# Patient Record
Sex: Female | Born: 1960 | Race: White | Hispanic: No | Marital: Single | Smoking: Never smoker
Health system: Southern US, Community
[De-identification: ages and names within clinical notes are randomized; demographics above are authoritative.]

## PROBLEM LIST (undated history)

## (undated) DIAGNOSIS — D0590 Unspecified type of carcinoma in situ of unspecified breast: Secondary | ICD-10-CM

## (undated) DIAGNOSIS — C801 Malignant (primary) neoplasm, unspecified: Secondary | ICD-10-CM

## (undated) DIAGNOSIS — N63 Unspecified lump in unspecified breast: Secondary | ICD-10-CM

## (undated) DIAGNOSIS — C50919 Malignant neoplasm of unspecified site of unspecified female breast: Secondary | ICD-10-CM

## (undated) DIAGNOSIS — N61 Mastitis without abscess: Secondary | ICD-10-CM

## (undated) DIAGNOSIS — M779 Enthesopathy, unspecified: Secondary | ICD-10-CM

## (undated) DIAGNOSIS — L02219 Cutaneous abscess of trunk, unspecified: Secondary | ICD-10-CM

## (undated) DIAGNOSIS — L03319 Cellulitis of trunk, unspecified: Secondary | ICD-10-CM

## (undated) DIAGNOSIS — Z923 Personal history of irradiation: Secondary | ICD-10-CM

## (undated) DIAGNOSIS — K769 Liver disease, unspecified: Secondary | ICD-10-CM

## (undated) HISTORY — DX: Malignant (primary) neoplasm, unspecified: C80.1

## (undated) HISTORY — PX: TONSILLECTOMY: SUR1361

## (undated) HISTORY — DX: Unspecified lump in unspecified breast: N63.0

## (undated) HISTORY — DX: Cutaneous abscess of trunk, unspecified: L02.219

## (undated) HISTORY — DX: Mastitis without abscess: N61.0

## (undated) HISTORY — DX: Enthesopathy, unspecified: M77.9

## (undated) HISTORY — DX: Unspecified type of carcinoma in situ of unspecified breast: D05.90

## (undated) HISTORY — DX: Cellulitis of trunk, unspecified: L03.319

## (undated) HISTORY — DX: Liver disease, unspecified: K76.9

---

## 1983-01-27 HISTORY — PX: ABDOMINAL HYSTERECTOMY: SHX81

## 2004-12-09 ENCOUNTER — Ambulatory Visit: Payer: Self-pay | Admitting: Otolaryngology

## 2006-02-28 ENCOUNTER — Emergency Department: Payer: Self-pay | Admitting: Unknown Physician Specialty

## 2007-02-17 ENCOUNTER — Ambulatory Visit: Payer: Self-pay | Admitting: Family Medicine

## 2007-12-15 DIAGNOSIS — G510 Bell's palsy: Secondary | ICD-10-CM | POA: Insufficient documentation

## 2008-01-27 DIAGNOSIS — C50919 Malignant neoplasm of unspecified site of unspecified female breast: Secondary | ICD-10-CM

## 2008-01-27 DIAGNOSIS — N63 Unspecified lump in unspecified breast: Secondary | ICD-10-CM

## 2008-01-27 DIAGNOSIS — C801 Malignant (primary) neoplasm, unspecified: Secondary | ICD-10-CM

## 2008-01-27 DIAGNOSIS — D0590 Unspecified type of carcinoma in situ of unspecified breast: Secondary | ICD-10-CM

## 2008-01-27 HISTORY — DX: Malignant (primary) neoplasm, unspecified: C80.1

## 2008-01-27 HISTORY — DX: Malignant neoplasm of unspecified site of unspecified female breast: C50.919

## 2008-01-27 HISTORY — PX: BREAST LUMPECTOMY: SHX2

## 2008-01-27 HISTORY — PX: BREAST SURGERY: SHX581

## 2008-01-27 HISTORY — DX: Unspecified type of carcinoma in situ of unspecified breast: D05.90

## 2008-01-27 HISTORY — DX: Unspecified lump in unspecified breast: N63.0

## 2008-12-26 ENCOUNTER — Ambulatory Visit: Payer: Self-pay | Admitting: Radiation Oncology

## 2008-12-26 HISTORY — PX: BREAST BIOPSY: SHX20

## 2009-01-08 ENCOUNTER — Ambulatory Visit: Payer: Self-pay | Admitting: General Surgery

## 2009-01-10 ENCOUNTER — Ambulatory Visit: Payer: Self-pay | Admitting: General Surgery

## 2009-01-24 ENCOUNTER — Ambulatory Visit: Payer: Self-pay | Admitting: Radiation Oncology

## 2009-01-26 ENCOUNTER — Ambulatory Visit: Payer: Self-pay | Admitting: Radiation Oncology

## 2009-01-26 DIAGNOSIS — M779 Enthesopathy, unspecified: Secondary | ICD-10-CM

## 2009-01-26 HISTORY — DX: Enthesopathy, unspecified: M77.9

## 2009-02-04 ENCOUNTER — Ambulatory Visit: Payer: Self-pay | Admitting: Radiation Oncology

## 2009-02-26 ENCOUNTER — Ambulatory Visit: Payer: Self-pay | Admitting: Radiation Oncology

## 2009-03-26 ENCOUNTER — Ambulatory Visit: Payer: Self-pay | Admitting: Radiation Oncology

## 2009-05-28 ENCOUNTER — Ambulatory Visit: Payer: Self-pay | Admitting: General Surgery

## 2009-06-26 ENCOUNTER — Ambulatory Visit: Payer: Self-pay | Admitting: Oncology

## 2009-07-08 ENCOUNTER — Ambulatory Visit: Payer: Self-pay | Admitting: Oncology

## 2009-07-26 ENCOUNTER — Ambulatory Visit: Payer: Self-pay | Admitting: Oncology

## 2009-08-19 ENCOUNTER — Ambulatory Visit: Payer: Self-pay | Admitting: Oncology

## 2009-08-26 ENCOUNTER — Ambulatory Visit: Payer: Self-pay | Admitting: Oncology

## 2009-09-02 ENCOUNTER — Ambulatory Visit: Payer: Self-pay | Admitting: Oncology

## 2009-11-27 ENCOUNTER — Ambulatory Visit: Payer: Self-pay | Admitting: Oncology

## 2009-12-03 ENCOUNTER — Ambulatory Visit: Payer: Self-pay | Admitting: General Surgery

## 2009-12-26 ENCOUNTER — Ambulatory Visit: Payer: Self-pay | Admitting: Oncology

## 2010-01-26 DIAGNOSIS — N61 Mastitis without abscess: Secondary | ICD-10-CM

## 2010-01-26 HISTORY — DX: Mastitis without abscess: N61.0

## 2010-04-03 ENCOUNTER — Ambulatory Visit: Payer: Self-pay | Admitting: Family Medicine

## 2010-05-12 ENCOUNTER — Ambulatory Visit: Payer: Self-pay | Admitting: Oncology

## 2010-05-27 ENCOUNTER — Ambulatory Visit: Payer: Self-pay | Admitting: Oncology

## 2010-06-05 ENCOUNTER — Ambulatory Visit: Payer: Self-pay | Admitting: General Surgery

## 2010-06-27 ENCOUNTER — Ambulatory Visit: Payer: Self-pay | Admitting: Oncology

## 2010-08-27 ENCOUNTER — Ambulatory Visit: Payer: Self-pay | Admitting: Oncology

## 2010-09-27 ENCOUNTER — Ambulatory Visit: Payer: Self-pay | Admitting: Oncology

## 2010-10-27 ENCOUNTER — Ambulatory Visit: Payer: Self-pay | Admitting: Oncology

## 2010-11-27 ENCOUNTER — Ambulatory Visit: Payer: Self-pay | Admitting: Oncology

## 2010-12-04 ENCOUNTER — Ambulatory Visit: Payer: Self-pay | Admitting: General Surgery

## 2011-01-27 HISTORY — PX: COLONOSCOPY: SHX174

## 2011-02-10 ENCOUNTER — Ambulatory Visit: Payer: Self-pay | Admitting: Oncology

## 2011-02-27 ENCOUNTER — Ambulatory Visit: Payer: Self-pay | Admitting: Oncology

## 2011-06-10 ENCOUNTER — Ambulatory Visit: Payer: Self-pay | Admitting: Oncology

## 2011-06-27 ENCOUNTER — Ambulatory Visit: Payer: Self-pay | Admitting: Oncology

## 2011-12-08 ENCOUNTER — Ambulatory Visit: Payer: Self-pay | Admitting: General Surgery

## 2012-03-18 ENCOUNTER — Ambulatory Visit: Payer: Self-pay | Admitting: Gastroenterology

## 2012-06-10 ENCOUNTER — Emergency Department: Payer: Self-pay | Admitting: Emergency Medicine

## 2012-06-10 LAB — CBC
HCT: 42.2 % (ref 35.0–47.0)
HGB: 14.8 g/dL (ref 12.0–16.0)
MCH: 29.7 pg (ref 26.0–34.0)
MCHC: 35.2 g/dL (ref 32.0–36.0)
MCV: 84 fL (ref 80–100)
RBC: 4.99 10*6/uL (ref 3.80–5.20)
RDW: 13.7 % (ref 11.5–14.5)
WBC: 8.8 10*3/uL (ref 3.6–11.0)

## 2012-06-10 LAB — URINALYSIS, COMPLETE
RBC,UR: 2203 /HPF (ref 0–5)
Specific Gravity: 1.022 (ref 1.003–1.030)
WBC UR: 2 /HPF (ref 0–5)

## 2012-06-10 LAB — COMPREHENSIVE METABOLIC PANEL
Albumin: 4.4 g/dL (ref 3.4–5.0)
Anion Gap: 9 (ref 7–16)
Calcium, Total: 9.3 mg/dL (ref 8.5–10.1)
Chloride: 107 mmol/L (ref 98–107)
Creatinine: 0.79 mg/dL (ref 0.60–1.30)
EGFR (African American): >60
Glucose: 125 mg/dL — ABNORMAL HIGH (ref 65–99)
Potassium: 3.3 mmol/L — ABNORMAL LOW (ref 3.5–5.1)
SGOT(AST): 26 U/L (ref 15–37)
SGPT (ALT): 23 U/L (ref 12–78)
Sodium: 138 mmol/L (ref 136–145)
Total Protein: 8.6 g/dL — ABNORMAL HIGH (ref 6.4–8.2)

## 2012-06-12 ENCOUNTER — Emergency Department: Payer: Self-pay | Admitting: Internal Medicine

## 2012-06-12 LAB — URINALYSIS, COMPLETE
Bilirubin,UR: NEGATIVE
Glucose,UR: NEGATIVE mg/dL (ref 0–75)
Leukocyte Esterase: NEGATIVE
Nitrite: NEGATIVE
Ph: 7 (ref 4.5–8.0)
Protein: NEGATIVE
RBC,UR: 1 /HPF (ref 0–5)
Squamous Epithelial: 1
WBC UR: 1 /HPF (ref 0–5)

## 2012-06-13 ENCOUNTER — Ambulatory Visit: Payer: Self-pay

## 2012-07-12 ENCOUNTER — Encounter: Payer: Self-pay | Admitting: General Surgery

## 2012-07-12 ENCOUNTER — Ambulatory Visit (INDEPENDENT_AMBULATORY_CARE_PROVIDER_SITE_OTHER): Payer: 59 | Admitting: General Surgery

## 2012-07-12 ENCOUNTER — Other Ambulatory Visit: Payer: Self-pay

## 2012-07-12 VITALS — BP 128/84 | HR 78 | Resp 14 | Ht 67.0 in | Wt 175.0 lb

## 2012-07-12 DIAGNOSIS — N63 Unspecified lump in unspecified breast: Secondary | ICD-10-CM

## 2012-07-12 DIAGNOSIS — Z853 Personal history of malignant neoplasm of breast: Secondary | ICD-10-CM | POA: Insufficient documentation

## 2012-07-12 NOTE — Patient Instructions (Addendum)
Continue self breast exams. Call office for any new breast issues or concerns. Follow up November bilateral mammograms

## 2012-07-12 NOTE — Progress Notes (Signed)
Patient ID: Chelsea Velasquez, female   DOB: Sep 28, 1960, 52 y.o.   MRN: [id number]  Chief Complaint  Patient presents with  . Other    left breast lomp    HPI Chelsea Velasquez is a 52 y.o. female Who presents for a breast evaluation referred by Dr Doylene Canning. The most recent mammogram was done on 12/08/11.Patient does perform regular self breast checks and gets regular mammograms done. Family history of breast cancer includes maternal grandmother. Patient feels a left breast lump upper portion for about 3 weeks. 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 Left breast biopsy was done 02-17-12, benign.   HPI  Past Medical History  Diagnosis Date  . Cancer 2010    left  . Cellulitis and abscess of trunk   . Liver problem   . Tendonitis 2011    left shoulder  . Inflammatory disease of breast 2012  . 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 Lump or mass in breast 2010    left breast    Past Surgical History  Procedure Laterality Date  . Breast surgery Left 2010    lumpectomy  . Breast biopsy Left 02-17-12    benign  . Abdominal hysterectomy  1985  . Tonsillectomy      Family History  Problem Relation Age of Onset  . Breast cancer Paternal Grandmother   . Cancer Paternal Grandmother 91    breast cancer  . Heart disease Father     died age 13  . Cancer Brother 48    throat    Social History History  Substance Use Topics  . Smoking status: Never Smoker   . Smokeless tobacco: Not on file  . Alcohol Use: No    No Known Allergies  No current outpatient prescriptions on file.   No current facility-administered medications for this visit.    Review of Systems Review of Systems  Constitutional: Negative.   Respiratory: Negative.   Cardiovascular: Negative.     Blood pressure 128/84, pulse 78, resp. rate 14, height 5\' 7"  (1.702 m), weight 175 lb (79.379 kg).  Physical Exam Physical Exam  Constitutional: She is oriented  to person, place, and time. She appears well-developed and well-nourished.  Pulmonary/Chest: Right breast exhibits no inverted nipple, no mass, no nipple discharge, no skin change and no tenderness. Left breast exhibits mass and tenderness. Left breast exhibits no inverted nipple, no nipple discharge and no skin change.  Lymphadenopathy:    She has no cervical adenopathy.    She has no axillary adenopathy.  Neurological: She is alert and oriented to person, place, and time.  Skin: Skin is warm.  1.5 cm mass left breast 10-11 o'clock position. 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 F/u as scheduled.        Catherene Kaleta G 07/13/2012, 4:09 PM

## 2012-07-13 ENCOUNTER — Encounter: Payer: Self-pay | Admitting: General Surgery

## 2012-07-13 ENCOUNTER — Encounter: Payer: Self-pay | Admitting: *Deleted

## 2012-07-13 DIAGNOSIS — N63 Unspecified lump in unspecified breast: Secondary | ICD-10-CM | POA: Insufficient documentation

## 2012-07-19 ENCOUNTER — Telehealth: Payer: Self-pay | Admitting: *Deleted

## 2012-07-19 NOTE — Telephone Encounter (Signed)
Pt called requesting order for CA 125 lab work to be done "peace of mind" with history of breast cancer.  RX left at front desk for pt to pick up to have labs done per Dr Evette Cristal.

## 2012-07-21 ENCOUNTER — Encounter: Payer: Self-pay | Admitting: General Surgery

## 2012-07-26 ENCOUNTER — Telehealth: Payer: Self-pay | Admitting: *Deleted

## 2012-07-26 NOTE — Telephone Encounter (Signed)
Message copied by Currie Paris on Tue Jul 26, 2012  9:18 AM ------      Message from: Kieth Brightly      Created: Sun Jul 24, 2012  9:30 AM       CA 125 is normal. Please inform pt. ------

## 2012-07-26 NOTE — Telephone Encounter (Signed)
Notified patient as instructed, patient pleased. Discussed follow-up appointments, patient agrees  

## 2012-08-26 HISTORY — PX: BREAST CYST ASPIRATION: SHX578

## 2012-12-13 ENCOUNTER — Ambulatory Visit: Payer: Self-pay | Admitting: General Surgery

## 2012-12-14 ENCOUNTER — Encounter: Payer: Self-pay | Admitting: General Surgery

## 2012-12-19 ENCOUNTER — Encounter: Payer: Self-pay | Admitting: General Surgery

## 2012-12-19 ENCOUNTER — Ambulatory Visit (INDEPENDENT_AMBULATORY_CARE_PROVIDER_SITE_OTHER): Payer: 59 | Admitting: General Surgery

## 2012-12-19 VITALS — BP 110/80 | HR 72 | Resp 12 | Ht 67.0 in | Wt 179.0 lb

## 2012-12-19 DIAGNOSIS — Z853 Personal history of malignant neoplasm of breast: Secondary | ICD-10-CM

## 2012-12-19 NOTE — Patient Instructions (Signed)
Return in 1 year with bilateral diagnostic mammogram. Schedule appointment with Dr. Doylene Canning.

## 2012-12-19 NOTE — Progress Notes (Signed)
Patient ID: Chelsea Velasquez, female   DOB: 1960/08/09, 52 y.o.   MRN: [id number]  Chief Complaint  Patient presents with  . Follow-up    mammogram     HPI Chelsea Velasquez is a 52 y.o. female who presents for a breast evaluation. The most recent mammogram was done on 12/13/12. Patient does perform regular self breast checks and gets regular mammograms done. The patient denies any new problems with the breasts at this time.    HPI  Past Medical History  Diagnosis Date  . Cancer 2010    left  . Cellulitis and abscess of trunk   . Liver problem   . Tendonitis 2011    left shoulder  . Inflammatory disease of breast 2012  . 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 Lump or mass in breast 2010    left breast    Past Surgical History  Procedure Laterality Date  . Breast surgery Left 2010    lumpectomy  . Breast biopsy Left 02-17-12    benign  . Abdominal hysterectomy  1985  . Tonsillectomy      Family History  Problem Relation Age of Onset  . Breast cancer Paternal Grandmother   . Cancer Paternal Grandmother 15    breast cancer  . Heart disease Father     died age 15  . Cancer Brother 48    throat    Social History History  Substance Use Topics  . Smoking status: Never Smoker   . Smokeless tobacco: Not on file  . Alcohol Use: No    No Known Allergies  Current Outpatient Prescriptions  Medication Sig Dispense Refill  . escitalopram (LEXAPRO) 10 MG tablet Take 1 tablet by mouth daily.      Marland Kitchen gabapentin (NEURONTIN) 300 MG capsule Take 1 capsule by mouth daily.       No current facility-administered medications for this visit.    Review of Systems Review of Systems  Constitutional: Negative.   Respiratory: Negative.   Cardiovascular: Negative.     Blood pressure 110/80, pulse 72, resp. rate 12, height 5\' 7"  (1.702 m), weight 179 lb (81.194 kg).  Physical Exam Physical Exam  Constitutional: She is oriented to person, place, and time. She appears  well-developed and well-nourished.  Eyes: Conjunctivae are normal. No scleral icterus.  Cardiovascular: Normal rate, regular rhythm and normal heart sounds.   Pulmonary/Chest: Breath sounds normal. Right breast exhibits no inverted nipple, no mass, no nipple discharge, no skin change and no tenderness. Left breast exhibits no inverted nipple, no mass, no nipple discharge, no skin change and no tenderness. Breasts are symmetrical.  Abdominal: Soft. Bowel sounds are normal. There is no tenderness.  Lymphadenopathy:    She has no cervical adenopathy.    She has no axillary adenopathy.  Neurological: She is alert and oriented to person, place, and time.  Skin: Skin is warm and dry.    Data Reviewed Mammogram reviewed. Labs reviewed.  Assessment    Stable exam. Left breast DCIS treated with lumpectomy/radiation  Patient is concerned about high levels of estrogen recently tested by Dr. Elease Hashimoto. She had discontinued Tamoxifen because of liver function abnormalities. She has had bilateral oophorectomy.     Plan    Patient to get appointment with Dr. Doylene Canning. Patient to return in 1 year with bilateral diagnostic mammogram.     SANKAR,SEEPLAPUTHUR G 12/21/2012, 6:18 AM

## 2012-12-20 ENCOUNTER — Telehealth: Payer: Self-pay | Admitting: *Deleted

## 2012-12-20 DIAGNOSIS — Z853 Personal history of malignant neoplasm of breast: Secondary | ICD-10-CM

## 2012-12-20 NOTE — Telephone Encounter (Signed)
Patient has been scheduled for an appointment with Dr. Doylene Canning at the [facility] for 12-27-12 at 2:30 pm. She is aware of date, time, and instructions.

## 2012-12-21 ENCOUNTER — Encounter: Payer: Self-pay | Admitting: General Surgery

## 2012-12-27 ENCOUNTER — Ambulatory Visit: Payer: Self-pay | Admitting: Oncology

## 2013-01-26 ENCOUNTER — Ambulatory Visit: Payer: Self-pay | Admitting: Oncology

## 2013-03-15 ENCOUNTER — Ambulatory Visit: Payer: Self-pay | Admitting: Family Medicine

## 2013-07-10 ENCOUNTER — Telehealth: Payer: Self-pay

## 2013-07-10 NOTE — Telephone Encounter (Signed)
Patient called and had some concerns about a breast mass that she has had for a while. She said that Dr Jamal Collin was aware of the area and it had done an ultrasound on it the last time she was here. She states that the area is still painful and that it has not improved since last seen. She would like a call back to speak with some one about this.

## 2013-07-11 ENCOUNTER — Telehealth: Payer: Self-pay | Admitting: *Deleted

## 2013-07-11 NOTE — Telephone Encounter (Signed)
States there is still a knot in her breast. Discussed $ being an issue. She thinks she might be too overly anxious. Always has pain that is unchanged. She does think it might be larger. Ice/heat gentle massage/Aleve all recommended. States she will Dr Venia Minks first then come here if needed. Will leave in recalls for now.

## 2013-07-11 NOTE — Telephone Encounter (Signed)
Pt is called you back, she said she had a voice mail from you. Best number to reach her today is [phone]

## 2013-11-13 ENCOUNTER — Encounter: Payer: Self-pay | Admitting: General Surgery

## 2013-11-22 ENCOUNTER — Ambulatory Visit: Payer: Self-pay | Admitting: Oncology

## 2013-11-26 ENCOUNTER — Ambulatory Visit: Payer: Self-pay | Admitting: Oncology

## 2013-11-27 ENCOUNTER — Encounter: Payer: Self-pay | Admitting: General Surgery

## 2013-12-25 ENCOUNTER — Ambulatory Visit: Payer: 59 | Admitting: General Surgery

## 2013-12-27 ENCOUNTER — Encounter: Payer: Self-pay | Admitting: General Surgery

## 2013-12-27 ENCOUNTER — Ambulatory Visit (INDEPENDENT_AMBULATORY_CARE_PROVIDER_SITE_OTHER): Payer: 59 | Admitting: General Surgery

## 2013-12-27 VITALS — BP 104/68 | HR 70 | Resp 12 | Ht 67.0 in | Wt 167.0 lb

## 2013-12-27 DIAGNOSIS — Z853 Personal history of malignant neoplasm of breast: Secondary | ICD-10-CM

## 2013-12-27 NOTE — Progress Notes (Signed)
Patient ID: Chelsea Velasquez, female   DOB: 1960-08-28, 53 y.o.   MRN: [id number]  Chief Complaint  Patient presents with  . Follow-up    bilateral diagnostic mammogram     HPI Chelsea Velasquez is a 53 y.o. female who presents for a breast cancer evaluation. The most recent mammogram was done on 12/15/13. Patient does perform regular self breast checks and gets regular mammograms done. The patient denies any new breast problems at this time.    HPI  Past Medical History  Diagnosis Date  . Cancer 2010    left  . Cellulitis and abscess of trunk   . Liver problem   . Tendonitis 2011    left shoulder  . Inflammatory disease of breast 2012  . 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 Lump or mass in breast 2010    left breast    Past Surgical History  Procedure Laterality Date  . Breast surgery Left 2010    lumpectomy  . Breast biopsy Left 02-17-12    benign  . Abdominal hysterectomy  1985  . Tonsillectomy      Family History  Problem Relation Age of Onset  . Breast cancer Paternal Grandmother   . Cancer Paternal Grandmother 35    breast cancer  . Heart disease Father     died age 68  . Cancer Brother 48    throat    Social History History  Substance Use Topics  . Smoking status: Never Smoker   . Smokeless tobacco: Not on file  . Alcohol Use: No    No Known Allergies  No current outpatient prescriptions on file.   No current facility-administered medications for this visit.    Review of Systems Review of Systems  Constitutional: Negative.   Respiratory: Negative.   Cardiovascular: Negative.     Blood pressure 104/68, pulse 70, resp. rate 12, height 5\' 7"  (1.702 m), weight 167 lb (75.751 kg).  Physical Exam Physical Exam  Constitutional: She is oriented to person, place, and time. She appears well-developed and well-nourished.  Eyes: Conjunctivae are normal. No scleral icterus.  Neck: Neck supple. No thyromegaly present.  Cardiovascular:  Normal rate, regular rhythm and normal heart sounds.   Pulmonary/Chest: Effort normal and breath sounds normal. Right breast exhibits no inverted nipple, no mass, no nipple discharge, no skin change and no tenderness. Left breast exhibits tenderness. Left breast exhibits no inverted nipple, no mass, no nipple discharge and no skin change.    Abdominal: Soft. There is no tenderness.  Lymphadenopathy:    She has no cervical adenopathy.    She has no axillary adenopathy.  Neurological: She is alert and oriented to person, place, and time.  Skin: Skin is warm and dry.  Vitals reviewed.   Data Reviewed Mammogram reviewed-stable.  Assessment    Exam stable. 25yrs post left breast lumpectomy and radiation. Pt had side effects with anti hormonal therapy and was discontinued few yrs ago. Follow up with     Plan    One year b/l diagnostic mammogram       Chelsea Velasquez 12/27/2013, 6:55 PM

## 2013-12-27 NOTE — Patient Instructions (Addendum)
B/l diagnostic screening mammogram in one year. Continue monthly self exam, call for any changes, new problems.

## 2014-04-17 IMAGING — CR DG ABDOMEN 1V
1 series · 2 of 2 positions shown · non-contrast
Comparison: none

REASON FOR EXAM: kidney stones
COMMENTS:

PROCEDURE:     KDR - KDXR KIDNEY URETER BLADDER  - June 13, 2012  [DATE]
RESULT:     Comparison: CT of the abdomen and pelvis 06/10/2012

[Series 1: supine kub · 0.17mm/px · 2 of 2 slices shown]
[im 1/2]
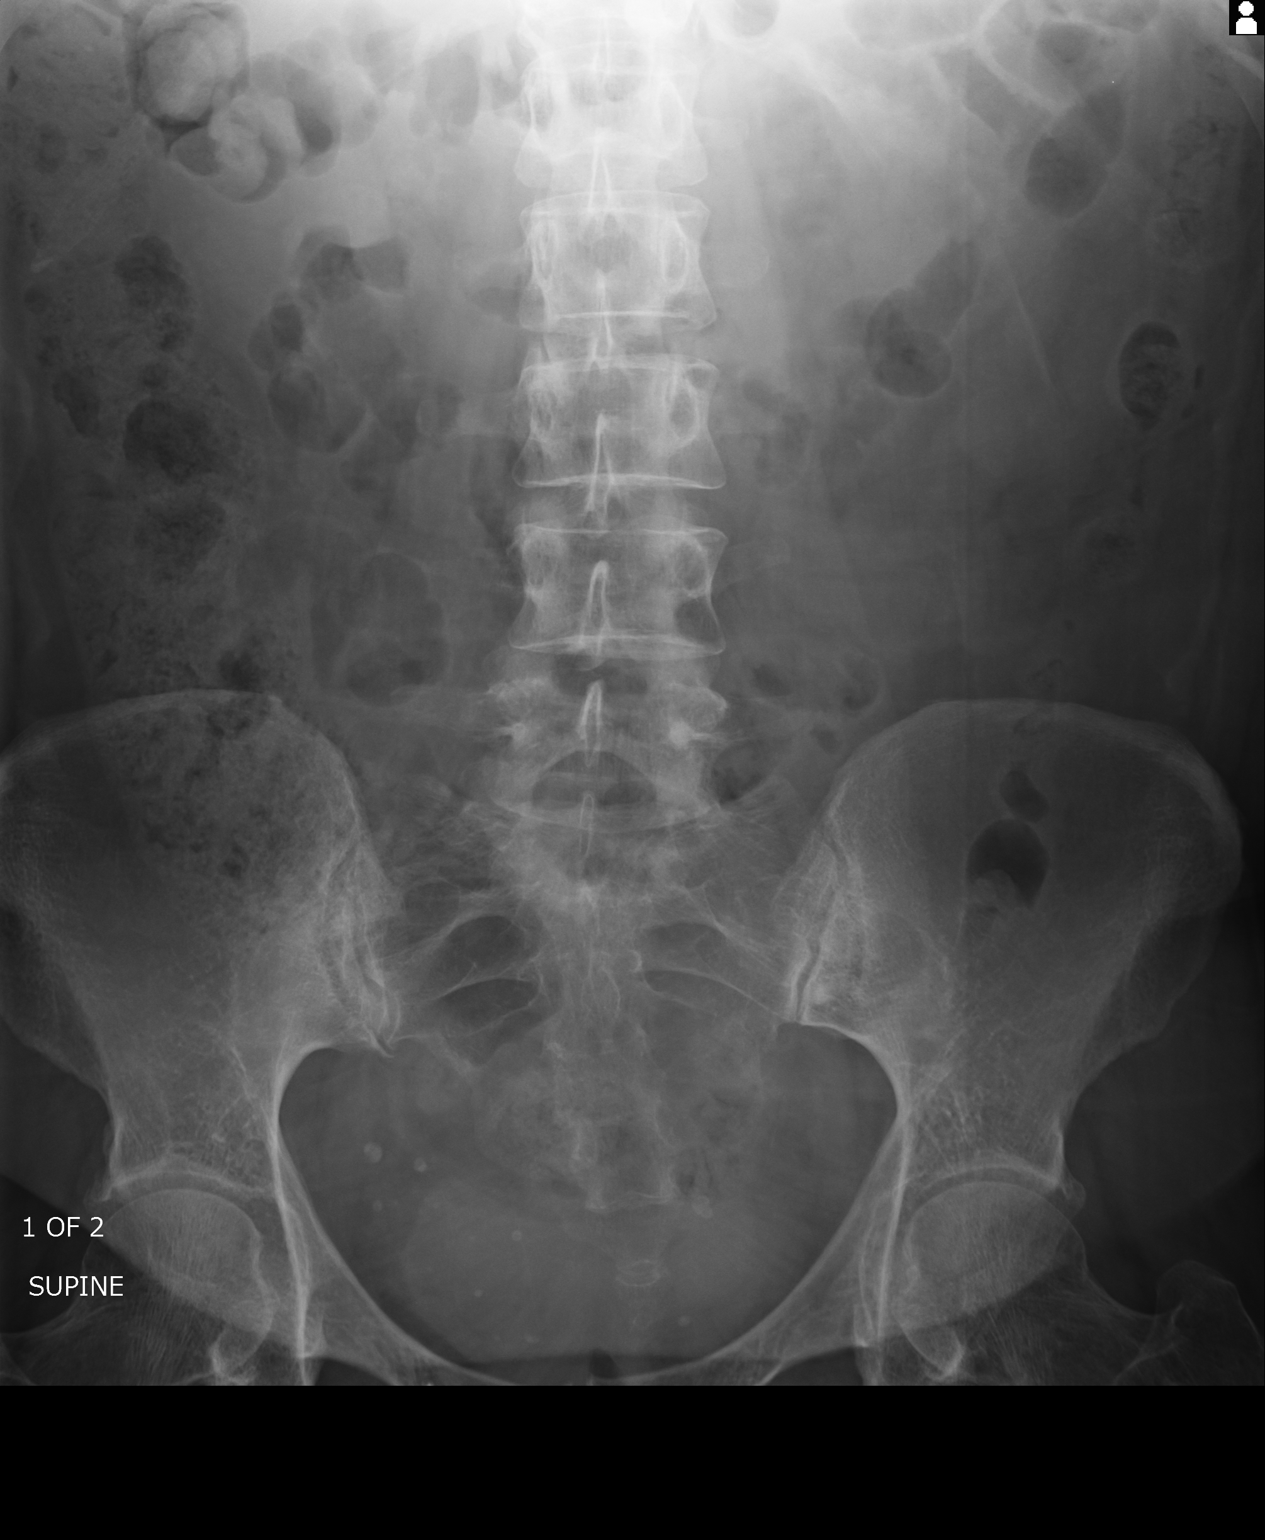
[im 2/2]
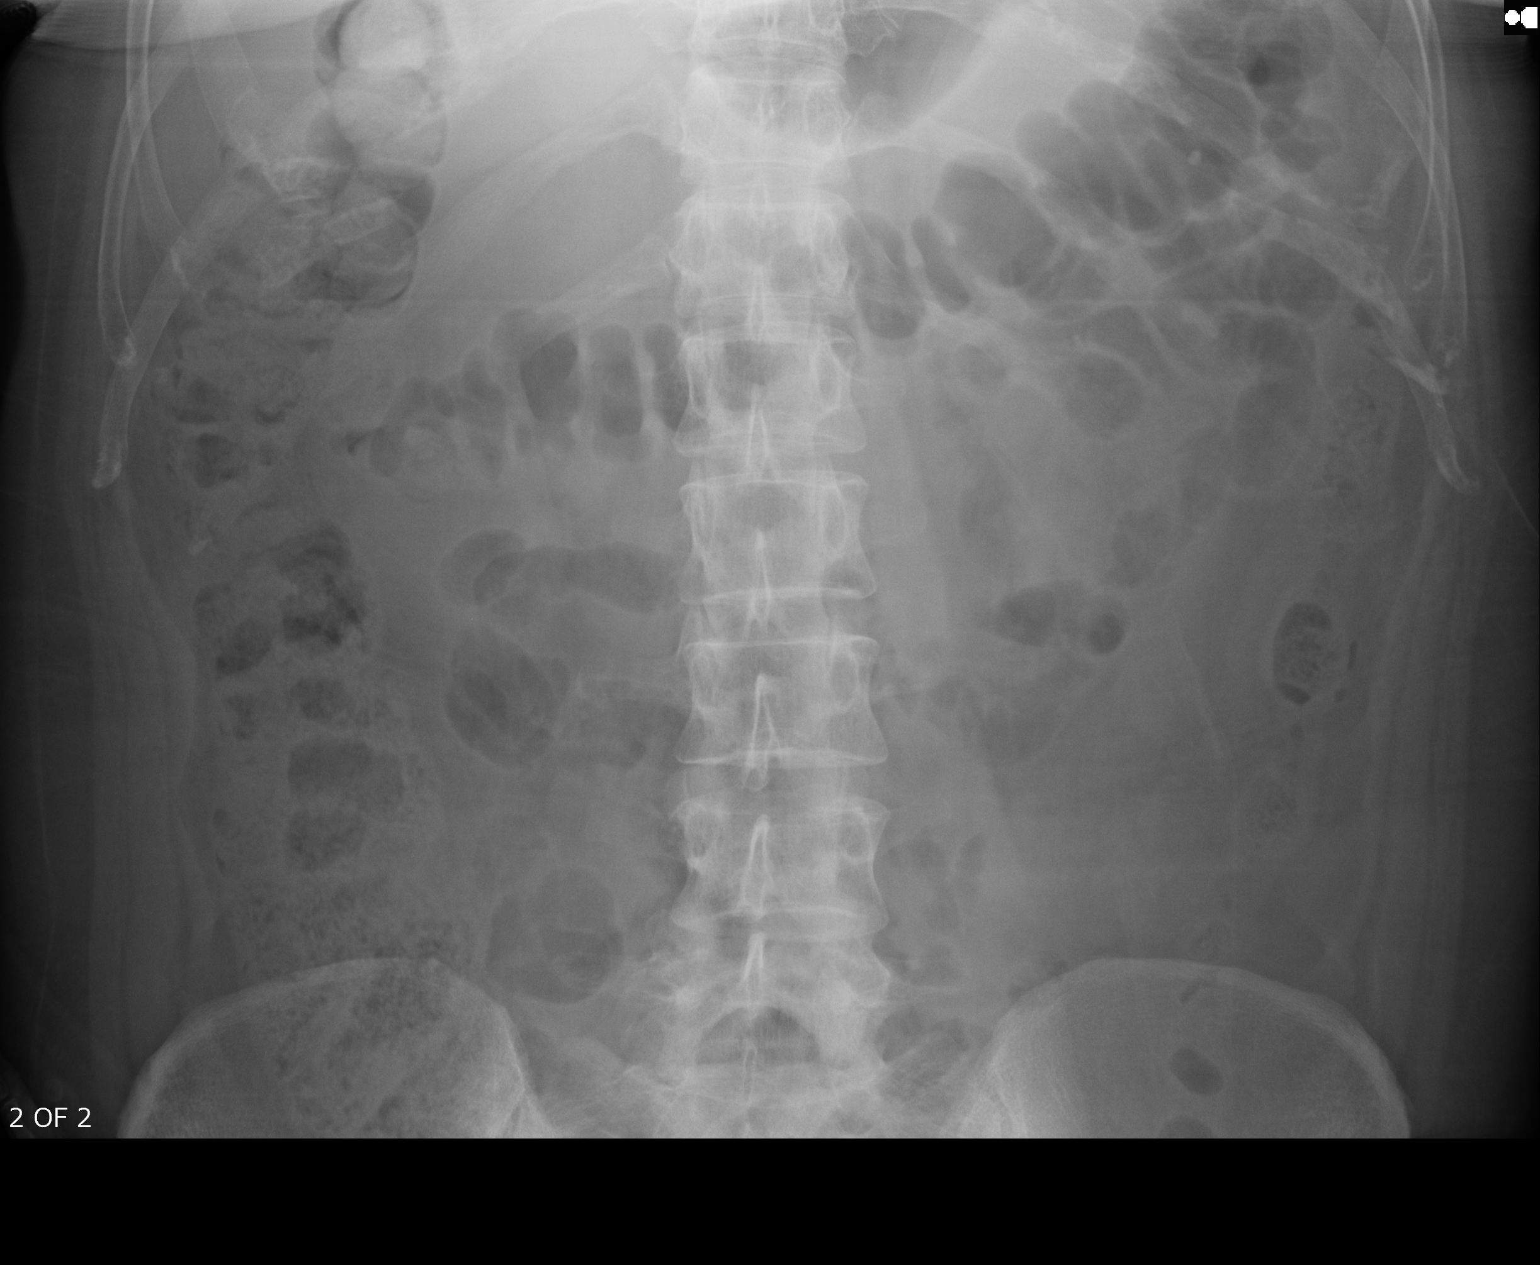

[2 of 2 positions shown; findings below may reference images not displayed]

FINDINGS: No definite renal calculi identified. There multiple calcifications in the
pelvis which likely represent phleboliths. Nonobstructed bowel gas pattern.
IMPRESSION: Multiple calcifications in the pelvis likely represent phleboliths. One of
these representing the distal right ureteral calculus cannot be excluded.

[REDACTED]

## 2014-12-17 ENCOUNTER — Encounter: Payer: Self-pay | Admitting: General Surgery

## 2014-12-25 ENCOUNTER — Ambulatory Visit: Payer: Self-pay | Admitting: General Surgery

## 2015-01-02 ENCOUNTER — Ambulatory Visit: Payer: Self-pay | Admitting: General Surgery

## 2015-02-05 ENCOUNTER — Encounter: Payer: Self-pay | Admitting: *Deleted

## 2015-02-11 ENCOUNTER — Encounter: Payer: Self-pay | Admitting: General Surgery

## 2015-10-10 ENCOUNTER — Encounter: Payer: Self-pay | Admitting: Family Medicine

## 2015-11-21 ENCOUNTER — Ambulatory Visit (INDEPENDENT_AMBULATORY_CARE_PROVIDER_SITE_OTHER): Payer: 59 | Admitting: Family Medicine

## 2015-11-21 VITALS — BP 108/62 | HR 64 | Temp 97.7°F | Resp 16 | Ht 66.0 in | Wt 184.0 lb

## 2015-11-21 DIAGNOSIS — Z0000 Encounter for general adult medical examination without abnormal findings: Secondary | ICD-10-CM | POA: Diagnosis not present

## 2015-11-21 DIAGNOSIS — Z923 Personal history of irradiation: Secondary | ICD-10-CM

## 2015-11-21 DIAGNOSIS — M7062 Trochanteric bursitis, left hip: Secondary | ICD-10-CM | POA: Diagnosis not present

## 2015-11-21 DIAGNOSIS — Z1231 Encounter for screening mammogram for malignant neoplasm of breast: Secondary | ICD-10-CM

## 2015-11-21 DIAGNOSIS — M8580 Other specified disorders of bone density and structure, unspecified site: Secondary | ICD-10-CM

## 2015-11-21 DIAGNOSIS — N959 Unspecified menopausal and perimenopausal disorder: Secondary | ICD-10-CM | POA: Diagnosis not present

## 2015-11-21 DIAGNOSIS — Z1321 Encounter for screening for nutritional disorder: Secondary | ICD-10-CM | POA: Diagnosis not present

## 2015-11-21 DIAGNOSIS — F329 Major depressive disorder, single episode, unspecified: Secondary | ICD-10-CM

## 2015-11-21 DIAGNOSIS — J309 Allergic rhinitis, unspecified: Secondary | ICD-10-CM

## 2015-11-21 DIAGNOSIS — F32A Depression, unspecified: Secondary | ICD-10-CM

## 2015-11-21 DIAGNOSIS — M7061 Trochanteric bursitis, right hip: Secondary | ICD-10-CM

## 2015-11-21 DIAGNOSIS — Z1239 Encounter for other screening for malignant neoplasm of breast: Secondary | ICD-10-CM

## 2015-11-21 MED ORDER — SERTRALINE HCL 50 MG PO TABS: 50.0000 mg | ORAL_TABLET | Freq: Every day | ORAL | 3 refills | Status: DC

## 2015-11-21 MED ORDER — AZELASTINE-FLUTICASONE 137-50 MCG/ACT NA SUSP: 1.0000 | Freq: Every day | NASAL | 12 refills | Status: DC

## 2015-11-21 MED ORDER — NAPROXEN 500 MG PO TABS: 500.0000 mg | ORAL_TABLET | Freq: Two times a day (BID) | ORAL | 0 refills | Status: DC

## 2015-11-21 NOTE — Progress Notes (Signed)
Patient: Chelsea Velasquez, Female    DOB: 11-15-1960, 55 y.o.   MRN: [id number] Visit Date: 11/21/2015  Today's Provider: Wilhemena Durie, MD   Chief Complaint  Patient presents with  . Annual Exam   Subjective:  Chelsea Velasquez is a 55 y.o. female who presents today for health maintenance and complete physical. She feels well. She reports exercising by walking twice weekly. She reports she is sleeping poorly. She is currently going through a divorce with her second husband. She has a 55 year old son by her first marriage and has 3 grandchildren and they all live in [place]  The patient is a former patient of Dr Sharyon Medicus.  She has not been seen by Dr Rosanna Randy at all in the past.  She is s/p hysterectomy and does not have pap smears any more.  She sees Dr Jamal Collin for her breast exam and mammogram due to her history of left breast cancer with lumpectomy and radiation treatment 5 years aog.    The patient reports having a colonoscopy at the age of 53 which would have been 4 years ago.  We do not have this report and she does not know who did the procedure but states it was done at the hospital.    The patient would like to have a Dexa scan done, her last one was 03/15/13 and she states she has osteopenia.     The patient states she has had her Flu shot through her work which is [employer].  The patient complains of increased stress in her life and it has resulted in increased anxiety.  She is currently going through a divorce and she is having difficulty sleeping.    She is currently not on any medication or supplements for anything.     Review of Systems  Constitutional: Negative for activity change, appetite change, chills, diaphoresis, fatigue, fever and unexpected weight change.  HENT: Positive for sinus pressure, sneezing and sore throat. Negative for congestion, dental problem, drooling, ear discharge, ear pain, facial swelling, hearing loss, mouth sores, nosebleeds,  postnasal drip, rhinorrhea, tinnitus, trouble swallowing and voice change.        Patient reports having difficulty with allergies.  Eyes: Negative for photophobia, pain, discharge, redness, itching and visual disturbance.  Respiratory: Negative for apnea, cough, choking, chest tightness, shortness of breath, wheezing and stridor.   Cardiovascular: Negative for chest pain, palpitations and leg swelling.  Gastrointestinal: Negative for abdominal distention, abdominal pain, anal bleeding, blood in stool, constipation, diarrhea, nausea, rectal pain and vomiting.  Endocrine: Negative for cold intolerance, heat intolerance, polydipsia, polyphagia and polyuria.  Genitourinary: Negative for decreased urine volume, difficulty urinating, dyspareunia, dysuria, enuresis, flank pain, frequency, genital sores, hematuria, menstrual problem, pelvic pain, urgency, vaginal bleeding, vaginal discharge and vaginal pain.  Musculoskeletal: Negative for back pain, gait problem, joint swelling, myalgias, neck pain and neck stiffness. Arthralgias: Right hip and left knee.  Skin: Negative for color change, pallor, rash and wound.  Allergic/Immunologic: Positive for environmental allergies. Negative for food allergies and immunocompromised state.  Neurological: Negative for dizziness, tremors, seizures, syncope, facial asymmetry, speech difficulty, weakness, light-headedness, numbness and headaches.  Hematological: Negative for adenopathy. Does not bruise/bleed easily.  Psychiatric/Behavioral: Positive for sleep disturbance. Negative for agitation, behavioral problems, confusion, decreased concentration, dysphoric mood, hallucinations, self-injury and suicidal ideas. The patient is nervous/anxious. The patient is not hyperactive.     Social History   Social History  . Marital status: Married    Spouse name: N/A  .  Number of children: N/A  . Years of education: N/A   Occupational History  . Not on file.   Social  History Main Topics  . Smoking status: Never Smoker  . Smokeless tobacco: Not on file  . Alcohol use No  . Drug use: No  . Sexual activity: Not on file   Other Topics Concern  . Not on file   Social History Narrative  . No narrative on file    Patient Active Problem List   Diagnosis Date Noted  . History of breast cancer 07/12/2012    Past Surgical History:  Procedure Laterality Date  . ABDOMINAL HYSTERECTOMY  1985  . BREAST BIOPSY Left 02-17-12   benign  . BREAST SURGERY Left 2010   lumpectomy  . TONSILLECTOMY      Her family history includes Breast cancer in her paternal grandmother; Cancer (age of onset: 38) in her brother; Cancer (age of onset: 75) in her paternal grandmother; Heart disease in her father.    No outpatient encounter prescriptions on file as of 11/21/2015.   No facility-administered encounter medications on file as of 11/21/2015.     Patient Care Team: Jerrol Banana., MD as PCP - General (Family Medicine) Seeplaputhur Robinette Haines, MD as Consulting Physician (General Surgery) Margarita Rana, MD as Referring Physician (Family Medicine)     Objective:   Vitals:  Vitals:   11/21/15 1400  BP: 108/62  Pulse: 64  Resp: 16  Temp: 97.7 F (36.5 C)  TempSrc: Oral  Weight: 184 lb (83.5 kg)  Height: 5\' 6"  (1.676 m)    Physical Exam  Constitutional: She is oriented to person, place, and time. She appears well-developed and well-nourished.  HENT:  Head: Normocephalic and atraumatic.  Right Ear: External ear normal.  Left Ear: External ear normal.  Nose: Nose normal.  Mouth/Throat: Oropharynx is clear and moist.  Eyes: Conjunctivae and EOM are normal. Pupils are equal, round, and reactive to light.  Neck: Normal range of motion. Neck supple.  Cardiovascular: Normal rate, regular rhythm, normal heart sounds and intact distal pulses.   Pulmonary/Chest: Effort normal and breath sounds normal.  Abdominal: Soft. Bowel sounds are normal.   Genitourinary:  Genitourinary Comments: Pelvic exam deferred due to hysterectomy.DRE and breast exam deferred at patient request. She says she has breast exam per Dr. Jamal Collin  Musculoskeletal: Normal range of motion. She exhibits tenderness.  Tender over right greater trochanter and medial joint line of left knee  Neurological: She is alert and oriented to person, place, and time. She has normal reflexes.  Skin: Skin is warm and dry.  Psychiatric: She has a normal mood and affect. Her behavior is normal. Judgment and thought content normal.  She admits to being depressed. She is not suicidal.     Depression Screen PHQ 2/9 Scores 11/21/2015  PHQ - 2 Score 5  PHQ- 9 Score 20   Fall Risk  11/21/2015  Falls in the past year? Yes  Number falls in past yr: 1  Injury with Fall? No   Current Exercise Habits: Home exercise routine, Type of exercise: walking, Frequency (Times/Week): 2    Functional Status Survey: Is the patient deaf or have difficulty hearing?: No Does the patient have difficulty seeing, even when wearing glasses/contacts?: No Does the patient have difficulty concentrating, remembering, or making decisions?: No Does the patient have difficulty walking or climbing stairs?: No Does the patient have difficulty dressing or bathing?: No Does the patient have difficulty doing errands alone  such as visiting a doctor's office or shopping?: No    Assessment & Plan:     Routine Health Maintenance and Physical Exam  Exercise Activities and Dietary recommendations Goals    None       There is no immunization history on file for this patient.  Health Maintenance  Topic Date Due  . Hepatitis C Screening  Oct 15, 1960  . HIV Screening  02/10/1976  . TETANUS/TDAP  02/10/1980  . PAP SMEAR  02/09/1982  . COLONOSCOPY  02/10/2011  . MAMMOGRAM  12/13/2016  . INFLUENZA VACCINE  Addressed     Recommend regular exercise for overall good health. Discussed health benefits of  physical activity, and encouraged her to engage in regular exercise appropriate for her age and condition.  Breast cancer--status post lumpectomy and radiation  Followed by Dr. Jamal Collin Allergic rhinitis Try Dymista for congestion. 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 May need Ortho referral. Possible sleep apnea Status post complete hysterectomy Major depressive disorder Start sertraline 50 mg daily.RTC 1 month.    I have done the exam and reviewed the chart and it is accurate to the best of my knowledge. Miguel Aschoff M.D. [facility]

## 2015-11-22 ENCOUNTER — Telehealth: Payer: Self-pay | Admitting: Family Medicine

## 2015-11-22 MED ORDER — AZELASTINE HCL 0.1 % NA SOLN: 2.0000 | Freq: Two times a day (BID) | NASAL | 12 refills | Status: DC

## 2015-11-22 NOTE — Telephone Encounter (Signed)
Have sent in rx for generic azelestine which should be much less expensive. It works best to take it WITH OTC Flonase

## 2015-11-22 NOTE — Telephone Encounter (Signed)
Left detailed message on pt's vm

## 2015-11-22 NOTE — Telephone Encounter (Signed)
Pt is requesting some cheaper than Azelastine-Fluticasone 137-50 MCG/ACT SUSP  Be sent to [facility]. Pt stated it would cost her 60 $ out of pocket. Pt was advised that Dr. Rosanna Randy is out of the office and request another provider look at sending something else in. Pt stated the pharmacy advised her they could suggest another medication to replace the Azelastine-Fluticasone 137-50 MCG/ACT SUSP. Please advise. Thanks TNP

## 2015-11-23 LAB — COMPREHENSIVE METABOLIC PANEL
ALBUMIN: 4.8 g/dL (ref 3.5–5.5)
ALT: 21 IU/L (ref 0–32)
AST: 21 IU/L (ref 0–40)
Albumin/Globulin Ratio: 1.5 (ref 1.2–2.2)
Alkaline Phosphatase: 117 IU/L (ref 39–117)
BUN / CREAT RATIO: 13 (ref 9–23)
BUN: 10 mg/dL (ref 6–24)
Bilirubin Total: 0.6 mg/dL (ref 0.0–1.2)
CALCIUM: 9.8 mg/dL (ref 8.7–10.2)
CO2: 22 mmol/L (ref 18–29)
CREATININE: 0.76 mg/dL (ref 0.57–1.00)
Chloride: 99 mmol/L (ref 96–106)
GFR, EST AFRICAN AMERICAN: 103 mL/min/{1.73_m2} (ref >59)
GFR, EST NON AFRICAN AMERICAN: 89 mL/min/{1.73_m2} (ref >59)
GLOBULIN, TOTAL: 3.1 g/dL (ref 1.5–4.5)
Glucose: 106 mg/dL — ABNORMAL HIGH (ref 65–99)
Potassium: 4.3 mmol/L (ref 3.5–5.2)
SODIUM: 141 mmol/L (ref 134–144)
TOTAL PROTEIN: 7.9 g/dL (ref 6.0–8.5)

## 2015-11-23 LAB — CBC WITH DIFFERENTIAL/PLATELET
BASOS: 1 %
Basophils Absolute: 0 10*3/uL (ref 0.0–0.2)
EOS (ABSOLUTE): 0.3 10*3/uL (ref 0.0–0.4)
EOS: 7 %
HEMATOCRIT: 43.3 % (ref 34.0–46.6)
HEMOGLOBIN: 14.2 g/dL (ref 11.1–15.9)
IMMATURE GRANS (ABS): 0 10*3/uL (ref 0.0–0.1)
IMMATURE GRANULOCYTES: 0 %
Lymphocytes Absolute: 1.6 10*3/uL (ref 0.7–3.1)
Lymphs: 36 %
MCH: 28.5 pg (ref 26.6–33.0)
MCHC: 32.8 g/dL (ref 31.5–35.7)
MCV: 87 fL (ref 79–97)
MONOCYTES: 5 %
Monocytes Absolute: 0.2 10*3/uL (ref 0.1–0.9)
NEUTROS PCT: 51 %
Neutrophils Absolute: 2.3 10*3/uL (ref 1.4–7.0)
Platelets: 280 10*3/uL (ref 150–379)
RBC: 4.99 x10E6/uL (ref 3.77–5.28)
RDW: 13.8 % (ref 12.3–15.4)
WBC: 4.5 10*3/uL (ref 3.4–10.8)

## 2015-11-23 LAB — LIPID PANEL WITH LDL/HDL RATIO
Cholesterol, Total: 247 mg/dL — ABNORMAL HIGH (ref 100–199)
HDL: 51 mg/dL (ref >39)
LDL CALC: 165 mg/dL — AB (ref 0–99)
LDL/HDL RATIO: 3.2 ratio (ref 0.0–3.2)
TRIGLYCERIDES: 153 mg/dL — AB (ref 0–149)
VLDL Cholesterol Cal: 31 mg/dL (ref 5–40)

## 2015-11-23 LAB — TSH: TSH: 1.07 u[IU]/mL (ref 0.450–4.500)

## 2015-11-23 LAB — VITAMIN D 25 HYDROXY (VIT D DEFICIENCY, FRACTURES): Vit D, 25-Hydroxy: 14.4 ng/mL — ABNORMAL LOW (ref 30.0–100.0)

## 2015-11-26 ENCOUNTER — Telehealth: Payer: Self-pay

## 2015-11-26 MED ORDER — VITAMIN D (ERGOCALCIFEROL) 1.25 MG (50000 UNIT) PO CAPS: 50000.0000 [IU] | ORAL_CAPSULE | ORAL | 5 refills | Status: DC

## 2015-11-26 NOTE — Telephone Encounter (Signed)
Pt advised and RX sent in-aa 

## 2015-11-26 NOTE — Telephone Encounter (Signed)
-----   Message from Jerrol Banana., MD sent at 11/24/2015  6:08 AM EDT ----- Stable. Work on diet and exercise as discussed. Start vitamin D 50,000 units weekly

## 2015-11-26 NOTE — Telephone Encounter (Signed)
Pt returned your call.  [phone]  Thanks Con Memos

## 2015-12-05 ENCOUNTER — Other Ambulatory Visit: Payer: Self-pay

## 2015-12-05 DIAGNOSIS — Z853 Personal history of malignant neoplasm of breast: Secondary | ICD-10-CM

## 2015-12-09 ENCOUNTER — Telehealth: Payer: Self-pay

## 2015-12-09 ENCOUNTER — Ambulatory Visit
Admission: RE | Admit: 2015-12-09 | Discharge: 2015-12-09 | Disposition: A | Discharge status: Home / Self Care | Payer: 59 | Source: Ambulatory Visit | Attending: Family Medicine | Admitting: Family Medicine

## 2015-12-09 DIAGNOSIS — M8580 Other specified disorders of bone density and structure, unspecified site: Secondary | ICD-10-CM | POA: Diagnosis present

## 2015-12-09 DIAGNOSIS — N959 Unspecified menopausal and perimenopausal disorder: Secondary | ICD-10-CM | POA: Insufficient documentation

## 2015-12-09 DIAGNOSIS — Z1239 Encounter for other screening for malignant neoplasm of breast: Secondary | ICD-10-CM

## 2015-12-09 DIAGNOSIS — Z923 Personal history of irradiation: Secondary | ICD-10-CM | POA: Insufficient documentation

## 2015-12-09 DIAGNOSIS — Z853 Personal history of malignant neoplasm of breast: Secondary | ICD-10-CM | POA: Insufficient documentation

## 2015-12-09 DIAGNOSIS — M810 Age-related osteoporosis without current pathological fracture: Secondary | ICD-10-CM | POA: Insufficient documentation

## 2015-12-09 MED ORDER — ALENDRONATE SODIUM 70 MG PO TABS: 70.0000 mg | ORAL_TABLET | ORAL | 11 refills | Status: DC

## 2015-12-09 NOTE — Telephone Encounter (Signed)
[id number] 

## 2015-12-09 NOTE — Telephone Encounter (Signed)
-----   Message from Jerrol Banana., MD sent at 12/09/2015  2:18 PM EST ----- Mild osteoporosis on BMD. Start alendronate 70 mg weekly. Keep appointment next month.

## 2015-12-09 NOTE — Telephone Encounter (Signed)
Patient was advised and prescription has been e-scribed to [facility] on [address]. KW

## 2015-12-10 ENCOUNTER — Other Ambulatory Visit: Payer: Self-pay | Admitting: *Deleted

## 2015-12-10 ENCOUNTER — Inpatient Hospital Stay
Admission: RE | Admit: 2015-12-10 | Discharge: 2015-12-10 | Disposition: A | Discharge status: Home / Self Care | Payer: Self-pay | Source: Ambulatory Visit | Attending: *Deleted | Admitting: *Deleted

## 2015-12-10 DIAGNOSIS — Z9289 Personal history of other medical treatment: Secondary | ICD-10-CM

## 2015-12-23 ENCOUNTER — Ambulatory Visit (INDEPENDENT_AMBULATORY_CARE_PROVIDER_SITE_OTHER): Payer: 59 | Admitting: Family Medicine

## 2015-12-23 VITALS — BP 114/72 | HR 80 | Temp 98.4°F | Resp 16 | Wt 186.0 lb

## 2015-12-23 DIAGNOSIS — E559 Vitamin D deficiency, unspecified: Secondary | ICD-10-CM

## 2015-12-23 DIAGNOSIS — F329 Major depressive disorder, single episode, unspecified: Secondary | ICD-10-CM

## 2015-12-23 DIAGNOSIS — J302 Other seasonal allergic rhinitis: Secondary | ICD-10-CM

## 2015-12-23 DIAGNOSIS — G4700 Insomnia, unspecified: Secondary | ICD-10-CM | POA: Insufficient documentation

## 2015-12-23 DIAGNOSIS — M816 Localized osteoporosis [Lequesne]: Secondary | ICD-10-CM

## 2015-12-23 DIAGNOSIS — C50919 Malignant neoplasm of unspecified site of unspecified female breast: Secondary | ICD-10-CM | POA: Insufficient documentation

## 2015-12-23 DIAGNOSIS — F32A Depression, unspecified: Secondary | ICD-10-CM | POA: Insufficient documentation

## 2015-12-23 DIAGNOSIS — M8580 Other specified disorders of bone density and structure, unspecified site: Secondary | ICD-10-CM | POA: Insufficient documentation

## 2015-12-23 DIAGNOSIS — G43909 Migraine, unspecified, not intractable, without status migrainosus: Secondary | ICD-10-CM | POA: Insufficient documentation

## 2015-12-23 MED ORDER — SERTRALINE HCL 100 MG PO TABS: 100.0000 mg | ORAL_TABLET | Freq: Every day | ORAL | 12 refills | Status: DC

## 2015-12-23 NOTE — Progress Notes (Signed)
Chelsea Velasquez  MRN: [id number] DOB: 1960-05-18  Subjective:  HPI  Patient is here for 1 month follow up. Depression: last time she was started on Zoloft and notices a little bit of a difference but not much. She had some nausea at first but this has subsided.  Routine labs were done last time and Vitamin D level was low so patient has been taking Vitamin D RX once a week. BMD that was ordered then showed osteoporosis and she has been started on Fosamax once a week. She is taking Naproxen for her hip still and this has helped the hip and her knee. She started Astelin nasal spray for allergies some relief with weather changes she has not felt 100%.  Patient Active Problem List   Diagnosis Date Noted  . History of breast cancer 07/12/2012    Past Medical History:  Diagnosis Date  . Cancer 2010   left  . 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 Cellulitis and abscess of trunk   . Inflammatory disease of breast 2012  . Liver problem   . Lump or mass in breast 2010   left breast  . Tendonitis 2011   left shoulder    Social History   Social History  . Marital status: Single    Spouse name: N/A  . Number of children: N/A  . Years of education: N/A   Occupational History  . Not on file.   Social History Main Topics  . Smoking status: Never Smoker  . Smokeless tobacco: Not on file  . Alcohol use No  . Drug use: No  . Sexual activity: Not on file   Other Topics Concern  . Not on file   Social History Narrative  . No narrative on file    Outpatient Encounter Prescriptions as of 12/23/2015  Medication Sig  . alendronate (FOSAMAX) 70 MG tablet Take 1 tablet (70 mg total) by mouth every 7 (seven) days. Take with a full glass of water on an empty stomach.  Marland Kitchen azelastine (ASTELIN) 0.1 % nasal spray Place 2 sprays into both nostrils 2 (two) times daily. Use in each nostril as directed  . naproxen (NAPROSYN) 500 MG tablet Take 1 tablet (500 mg total) by mouth 2  (two) times daily with a meal.  . sertraline (ZOLOFT) 50 MG tablet Take 1 tablet (50 mg total) by mouth daily.  . Vitamin D, Ergocalciferol, (DRISDOL) 50000 units CAPS capsule Take 1 capsule (50,000 Units total) by mouth every 7 (seven) days.   No facility-administered encounter medications on file as of 12/23/2015.     No Known Allergies  Review of Systems  Constitutional: Negative.   HENT: Positive for congestion.   Respiratory: Negative.   Cardiovascular: Negative.   Musculoskeletal: Positive for joint pain.  Psychiatric/Behavioral: Positive for depression. The patient is nervous/anxious.        Slightly better    Objective:  BP 114/72   Pulse 80   Temp 98.4 F (36.9 C)   Resp 16   Wt 186 lb (84.4 kg)   BMI 30.02 kg/m   Physical Exam  Constitutional: She is oriented to person, place, and time and well-developed, well-nourished, and in no distress.  HENT:  Head: Normocephalic and atraumatic.  Eyes: No scleral icterus.  Cardiovascular: Normal rate, regular rhythm, normal heart sounds and intact distal pulses.   No murmur heard. Pulmonary/Chest: Effort normal and breath sounds normal. No respiratory distress. She has no wheezes.  Neurological: She is alert and oriented to person, place, and time.  Skin: Skin is warm and dry.  Psychiatric: Memory, affect and judgment normal.    Assessment and Plan :  1. Depression, unspecified depression type Slight improvement. increase Zoloft to 100 mg and re check in 2 months. Patient very unhappy that she was discharged to co-pay at the time of her physical. Several issues were dealt with. Will not discharge her today and have her follow up with another provider. 2. Avitaminosis D Continue Vitamin D prescription for now.  3. Localized osteoporosis without current pathological fracture New-mild in the hip/left side. Continue Fosamax until 2022 for now. Re check BMD in 2 to 3 years.  4. AR Not much better, will refer to ENT for  further treatment options. HPI, Exam and A&P transcribed under direction and in the presence of Miguel Aschoff, MD.

## 2016-01-02 ENCOUNTER — Ambulatory Visit
Admission: RE | Admit: 2016-01-02 | Discharge: 2016-01-02 | Disposition: A | Discharge status: Home / Self Care | Payer: 59 | Source: Ambulatory Visit | Attending: General Surgery | Admitting: General Surgery

## 2016-01-02 ENCOUNTER — Other Ambulatory Visit: Payer: Self-pay | Admitting: General Surgery

## 2016-01-02 DIAGNOSIS — Z853 Personal history of malignant neoplasm of breast: Secondary | ICD-10-CM

## 2016-01-03 ENCOUNTER — Encounter: Payer: Self-pay | Admitting: *Deleted

## 2016-01-08 ENCOUNTER — Encounter: Payer: Self-pay | Admitting: General Surgery

## 2016-01-08 ENCOUNTER — Other Ambulatory Visit: Payer: Self-pay | Admitting: *Deleted

## 2016-01-08 ENCOUNTER — Ambulatory Visit (INDEPENDENT_AMBULATORY_CARE_PROVIDER_SITE_OTHER): Payer: 59 | Admitting: General Surgery

## 2016-01-08 VITALS — BP 130/78 | HR 82 | Resp 12 | Ht 67.0 in | Wt 184.0 lb

## 2016-01-08 DIAGNOSIS — Z170 Estrogen receptor positive status [ER+]: Secondary | ICD-10-CM

## 2016-01-08 DIAGNOSIS — C50512 Malignant neoplasm of lower-outer quadrant of left female breast: Secondary | ICD-10-CM

## 2016-01-08 NOTE — Progress Notes (Signed)
Patient ID: Chelsea Velasquez, female   DOB: 03-11-1960, 55 y.o.   MRN: [id number]  Chief Complaint  Patient presents with  . Follow-up    HPI Chelsea Velasquez is a 55 y.o. female.  who presents for her breast cancer follow up and a breast evaluation. The most recent mammogram was done on 01-02-16.  Patient does perform regular self breast checks and gets regular mammograms done.  Left breast remains tender in the inferior aspect where she had the lumpectomy and radiation. I have reviewed the history of present illness with the patient.   HPI  Past Medical History:  Diagnosis Date  . Cancer [facility]) 2010   left lumpectomy with mammosite  . 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 Cellulitis and abscess of trunk   . Inflammatory disease of breast 2012  . Liver problem   . Lump or mass in breast 2010   left breast  . Tendonitis 2011   left shoulder    Past Surgical History:  Procedure Laterality Date  . ABDOMINAL HYSTERECTOMY  1985  . BREAST BIOPSY Left 12/26/2008   invasive ductal carcinoma  . BREAST CYST ASPIRATION Left 08/2012   benign done in Dr Angie Fava office  . BREAST SURGERY Left 2010   lumpectomy  . TONSILLECTOMY      Family History  Problem Relation Age of Onset  . Heart disease Father     died age 57  . Breast cancer Paternal Grandmother 36  . Cancer Brother 48    throat    Social History Social History  Substance Use Topics  . Smoking status: Never Smoker  . Smokeless tobacco: Never Used  . Alcohol use No    No Known Allergies  Current Outpatient Prescriptions  Medication Sig Dispense Refill  . alendronate (FOSAMAX) 70 MG tablet Take 1 tablet (70 mg total) by mouth every 7 (seven) days. Take with a full glass of water on an empty stomach. 4 tablet 11  . azelastine (ASTELIN) 0.1 % nasal spray Place 2 sprays into both nostrils 2 (two) times daily. Use in each nostril as directed 30 mL 12  . naproxen (NAPROSYN) 500 MG tablet Take 1 tablet (500 mg  total) by mouth 2 (two) times daily with a meal. 30 tablet 0  . sertraline (ZOLOFT) 100 MG tablet Take 1 tablet (100 mg total) by mouth daily. 30 tablet 12  . Vitamin D, Ergocalciferol, (DRISDOL) 50000 units CAPS capsule Take 1 capsule (50,000 Units total) by mouth every 7 (seven) days. 4 capsule 5   No current facility-administered medications for this visit.     Review of Systems Review of Systems  Constitutional: Negative.   Respiratory: Negative.   Cardiovascular: Negative.     Blood pressure 130/78, pulse 82, resp. rate 12, height 5\' 7"  (1.702 m), weight 184 lb (83.5 kg).  Physical Exam Physical Exam  Constitutional: She is oriented to person, place, and time. She appears well-developed and well-nourished.  HENT:  Mouth/Throat: Oropharynx is clear and moist.  Eyes: Conjunctivae are normal. No scleral icterus.  Neck: Neck supple.  Cardiovascular: Normal rate, regular rhythm and normal heart sounds.   Pulmonary/Chest: Effort normal and breath sounds normal. Right breast exhibits no inverted nipple, no mass, no nipple discharge, no skin change and no tenderness. Left breast exhibits tenderness. Left breast exhibits no inverted nipple, no mass, no nipple discharge and no skin change.    Abdominal: Soft. There is no tenderness.  Lymphadenopathy:  She has no cervical adenopathy.    She has no axillary adenopathy.  Neurological: She is alert and oriented to person, place, and time.  Skin: Skin is warm and dry.  Psychiatric: Her behavior is normal.    Data Reviewed Mammogram reviewed- heavy dystrophic calcification inferior left breast  Assessment    Exam stable. 7 yrs post left breast lumpectomy and radiation for hormone pos invasive CA. Patient had side effects with anti hormonal therapy and was discontinued several years ago.  Given that she still has pain and tenderness in left breast will get plastic surgery consult for possible scar release ans revision. Ptis amenable  to this..    Plan    Follow up in one year with bilateral screening mammogram. Appointment with plastic surgeon, Dr Marla Roe or associates regarding left breast incisional pain for possible scar release or revision.     This information has been scribed by Chelsea Velasquez, Chelsea Velasquez,Chelsea Velasquez.   Chelsea Velasquez 01/08/2016, 3:17 PM

## 2016-01-08 NOTE — Patient Instructions (Addendum)
The patient is aware to call back for any questions or concerns. Follow up in one year with bilateral screening mammogram. Appointment with Dr Marla Roe or associates

## 2016-01-28 ENCOUNTER — Telehealth: Payer: Self-pay | Admitting: *Deleted

## 2016-01-28 NOTE — Telephone Encounter (Signed)
Dr. Marla Roe called wanted to let you know that she recently saw this patient and she wanted you to know that she will be able to help her. If you have any questions you can call her. Thanks

## 2016-11-18 ENCOUNTER — Other Ambulatory Visit: Payer: Self-pay

## 2016-11-18 DIAGNOSIS — Z1231 Encounter for screening mammogram for malignant neoplasm of breast: Secondary | ICD-10-CM

## 2016-12-16 ENCOUNTER — Encounter: Payer: Self-pay | Admitting: Dietician

## 2016-12-16 ENCOUNTER — Encounter: Payer: 59 | Attending: Family Medicine | Admitting: Dietician

## 2016-12-16 VITALS — Ht 67.0 in | Wt 178.9 lb

## 2016-12-16 DIAGNOSIS — E785 Hyperlipidemia, unspecified: Secondary | ICD-10-CM | POA: Insufficient documentation

## 2016-12-16 DIAGNOSIS — E7800 Pure hypercholesterolemia, unspecified: Secondary | ICD-10-CM

## 2016-12-16 DIAGNOSIS — R7303 Prediabetes: Secondary | ICD-10-CM | POA: Insufficient documentation

## 2016-12-16 DIAGNOSIS — Z713 Dietary counseling and surveillance: Secondary | ICD-10-CM | POA: Diagnosis not present

## 2016-12-16 DIAGNOSIS — E663 Overweight: Secondary | ICD-10-CM | POA: Diagnosis present

## 2016-12-16 DIAGNOSIS — E782 Mixed hyperlipidemia: Secondary | ICD-10-CM

## 2016-12-16 NOTE — Progress Notes (Signed)
Medical Nutrition Therapy: Visit start time: 1630  end time: 1730  Assessment:  Diagnosis: pre-diabetes, HLD, overweight Past medical history: as listed above Psychosocial issues/ stress concerns: none; does report some stress eating Preferred learning method:  . Auditory  Current weight: 178.9lbs  Height: 5'7" Medications, supplements: reconciled list in medical record  Progress and evaluation: Patient reports some weight gain in recent months, wants to lose to goal of 150lbs. She is concerned with preventing diabetes as well as heart disease. Heart disease runs in the family. She is eating many meals on the run, sometimes low hunger during the day.    Physical activity: walking 30 minutes, 2 times a week  Dietary Intake:  Usual eating pattern includes 3 meals and 1-2 snacks per day. Dining out frequency: 2 meals per week.  Breakfast: protein shake (30g protein) on way to work, 5am Snack: maybe protein bar Lunch: usually not hungry, often apple, cheese, nuts or another protein bar or shake.  Snack: fruit or yogurt Supper: takeout chinese; shake with protein and vegetables  Snack: occasionally popcorn  Beverages: water (mostly after work), some flavored waters, sugar free; coffee in am at work.  Nutrition Care Education: Topics covered: weight control, diabetes prevention, hyperlipidemia Basic nutrition: basic food groups, appropriate nutrient balance, appropriate meal and snack schedule, general nutrition guidelines    Weight control: benefits of weight control; appropriate nutrient balance and kcal control; plate method meal planning Hyperglycemia: appropriate carb intake and balance, role of exercise and weight control Hyperlipidemia:  target goals for lipids; healthy and unhealthy fats; role of fiber, food sources of soluble fiber, plant sterols; mediterranean-style diet Other lifestyle changes:  Role of exercise in weight control as well as blood sugar and cholesterol  control.  Nutritional Diagnosis:  [place]-2.2 Altered nutrition-related laboratory As related to hyperlipidemia, pre-diabetes.  As evidenced by Total cholesterol 221, LDL 145, HbA1C 5.7%. [place]-3.3 Overweight/obesity As related to excess calories.  As evidenced by BMI 27.8, patient report.  Intervention: Instruction as noted above.   Set goal with direction from patient.    She will concentrate on improving balance of nutrition in her meals.   No follow-up scheduled at this time, patient will schedule later if needed.  Education Materials given:  . Plate Planner with food lists . Sample meal pattern/ menus: Mediterranean Diet . Food Sources of Soluble Fiber (Can) . Goals/ instructions  Learner/ who was taught:  . Patient   Level of understanding: Marland Kitchen Verbalizes/ demonstrates competency  Demonstrated degree of understanding via:   Teach back Learning barriers: . None  Willingness to learn/ readiness for change: . Eager, change in progress  Monitoring and Evaluation:  Dietary intake, exercise, BG and lipid levels, and body weight      follow up: prn

## 2016-12-16 NOTE — Patient Instructions (Signed)
   Add some healthy grain foods in small portions, such as oatmeal, whole grain cereal or bread.  Include beans and nuts several times a week for healthy fiber and healthy fat.   Plan ahead to allow for balanced meals that include plenty of veggies and fruits, some healthy grain/ starch, and lean protein.  Keep up plenty of daily movement and exercise.

## 2017-01-07 ENCOUNTER — Encounter (INDEPENDENT_AMBULATORY_CARE_PROVIDER_SITE_OTHER): Payer: Self-pay

## 2017-01-07 ENCOUNTER — Ambulatory Visit
Admission: RE | Admit: 2017-01-07 | Discharge: 2017-01-07 | Disposition: A | Discharge status: Home / Self Care | Payer: 59 | Source: Ambulatory Visit | Attending: General Surgery | Admitting: General Surgery

## 2017-01-07 DIAGNOSIS — Z1231 Encounter for screening mammogram for malignant neoplasm of breast: Secondary | ICD-10-CM

## 2017-01-07 HISTORY — DX: Malignant neoplasm of unspecified site of unspecified female breast: C50.919

## 2017-01-07 HISTORY — DX: Personal history of irradiation: Z92.3

## 2017-01-11 ENCOUNTER — Encounter: Payer: Self-pay | Admitting: General Surgery

## 2017-01-11 ENCOUNTER — Ambulatory Visit: Payer: 59 | Admitting: General Surgery

## 2017-01-11 VITALS — BP 132/74 | HR 78 | Ht 67.0 in | Wt 178.0 lb

## 2017-01-11 DIAGNOSIS — Z170 Estrogen receptor positive status [ER+]: Secondary | ICD-10-CM | POA: Diagnosis not present

## 2017-01-11 DIAGNOSIS — C50512 Malignant neoplasm of lower-outer quadrant of left female breast: Secondary | ICD-10-CM | POA: Diagnosis not present

## 2017-01-11 NOTE — Progress Notes (Signed)
Patient ID: Chelsea Velasquez, female   DOB: 04-22-1960, 56 y.o.   MRN: [id number]  Chief Complaint  Patient presents with  . Follow-up    HPI Chelsea Velasquez is a 56 y.o. female who presents for a breast cancer follow up evaluation. The most recent mammogram was done on 01/04/2017.  Patient does perform regular self breast checks and gets regular mammograms done.    HPI  Past Medical History:  Diagnosis Date  . Breast cancer ([place])    left  . Cancer [facility]) 2010   left lumpectomy with mammosite  . 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 Cellulitis and abscess of trunk   . Inflammatory disease of breast 2012  . Liver problem   . Lump or mass in breast 2010   left breast  . Personal history of radiation therapy   . Tendonitis 2011   left shoulder    Past Surgical History:  Procedure Laterality Date  . ABDOMINAL HYSTERECTOMY  1985  . BREAST BIOPSY Left 12/26/2008   invasive ductal carcinoma  . BREAST CYST ASPIRATION Left 08/2012   benign done in Dr Angie Fava office  . BREAST LUMPECTOMY Left 2010   positive  . BREAST SURGERY Left 2010   lumpectomy  . TONSILLECTOMY      Family History  Problem Relation Age of Onset  . Heart disease Father        died age 65  . Breast cancer Paternal Grandmother 44  . Cancer Brother 48       throat    Social History Social History   Tobacco Use  . Smoking status: Never Smoker  . Smokeless tobacco: Never Used  Substance Use Topics  . Alcohol use: Yes    Alcohol/week: 1.8 oz    Types: 3 Glasses of wine per week  . Drug use: No    Allergies  Allergen Reactions  . Doxycycline Other (See Comments)    Caused wheezing, nausea, vomiting, elevated BP, dizziness    Current Outpatient Medications  Medication Sig Dispense Refill  . alendronate (FOSAMAX) 70 MG tablet Take 1 tablet (70 mg total) by mouth every 7 (seven) days. Take with a full glass of water on an empty stomach. 4 tablet 11  . azelastine (ASTELIN) 0.1 % nasal  spray Place 2 sprays into both nostrils 2 (two) times daily. Use in each nostril as directed 30 mL 12  . Calcium Carbonate-Vitamin D 600-400 MG-UNIT tablet Take by mouth.    . fluticasone (FLONASE) 50 MCG/ACT nasal spray Place into the nose.    . naproxen sodium (ALEVE) 220 MG tablet Take by mouth.    . Vitamin D, Ergocalciferol, (DRISDOL) 50000 units CAPS capsule Take 1 capsule (50,000 Units total) by mouth every 7 (seven) days. 4 capsule 5   No current facility-administered medications for this visit.     Review of Systems Review of Systems  Constitutional: Negative.   Respiratory: Negative.   Cardiovascular: Negative.     Blood pressure 132/74, pulse 78, height 5\' 7"  (1.702 m), weight 178 lb (80.7 kg).  Physical Exam Physical Exam  Constitutional: She is oriented to person, place, and time. She appears well-developed and well-nourished.  Eyes: Conjunctivae are normal. No scleral icterus.  Neck: Neck supple.  Cardiovascular: Normal rate, regular rhythm and normal heart sounds.  Pulmonary/Chest: Effort normal and breath sounds normal. Right breast exhibits no inverted nipple, no mass, no nipple discharge, no skin change and no tenderness. Left breast  exhibits tenderness. Left breast exhibits no inverted nipple, no mass, no nipple discharge and no skin change.    Abdominal: Soft. Bowel sounds are normal. There is no tenderness.  Lymphadenopathy:    She has no cervical adenopathy.    She has no axillary adenopathy.  Neurological: She is alert and oriented to person, place, and time.  Skin: Skin is warm and dry.    Data Reviewed Mammogram reviewed   Assessment       Exam stable. 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    Plan    Patient will be asked to return to the office in one year with a bilateral screening mammogram with Dr. Bary Castilla. The patient is aware to call back for any questions or concerns.     HPI, Physical  Exam, Assessment and Plan have been scribed under the direction and in the presence of Mckinley Jewel, MD  Gaspar Cola, CMA I have completed the exam and reviewed the above documentation for accuracy and completeness.  I agree with the above.  Haematologist has been used and any errors in dictation or transcription are unintentional.  Seeplaputhur G. Jamal Collin, M.D., F.A.C.S.    Junie Panning G 01/13/2017, 1:07 PM

## 2017-01-11 NOTE — Patient Instructions (Signed)
Patient will be asked to return to the office in one year with a bilateral screening mammogram with Dr. Bary Castilla. The patient is aware to call back for any questions or concerns.

## 2017-02-16 ENCOUNTER — Encounter: Payer: Self-pay | Admitting: General Surgery

## 2017-11-05 IMAGING — MG MM DIGITAL DIAGNOSTIC BILAT W/ TOMO W/ CAD
8 of 15 series · 8 of 31 positions shown · non-contrast
Comparison: Previous exam(s).

CLINICAL DATA: History of treated left breast cancer, status post
lumpectomy and radiation therapy in 3757. Patient complains of
persistent left breast pain.

EXAM:
2D DIGITAL DIAGNOSTIC BILATERAL MAMMOGRAM WITH CAD AND ADJUNCT TOMO

[R MLO]
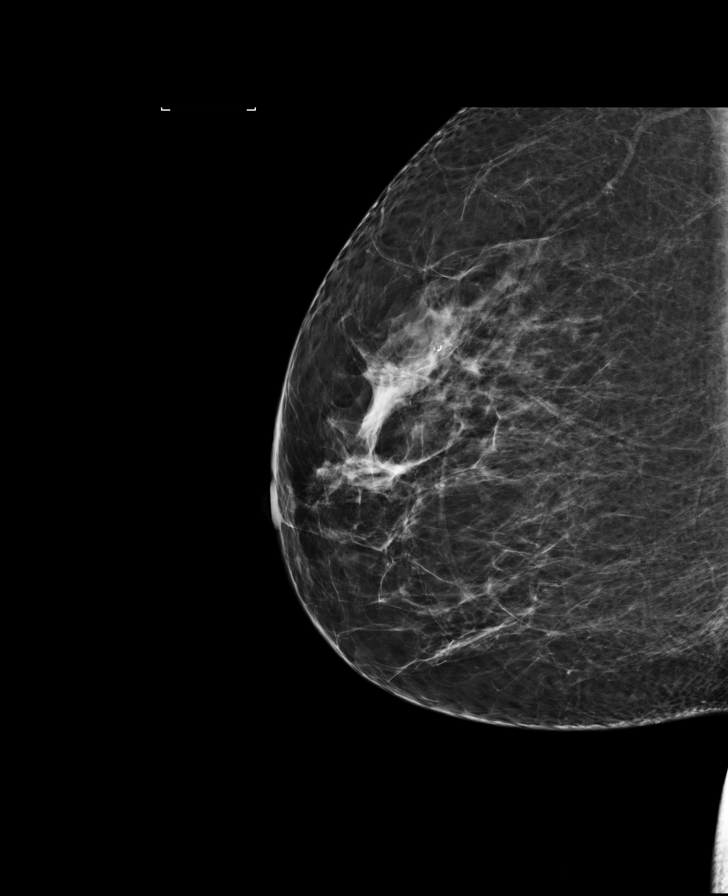

[L MLO (1 of 3)]
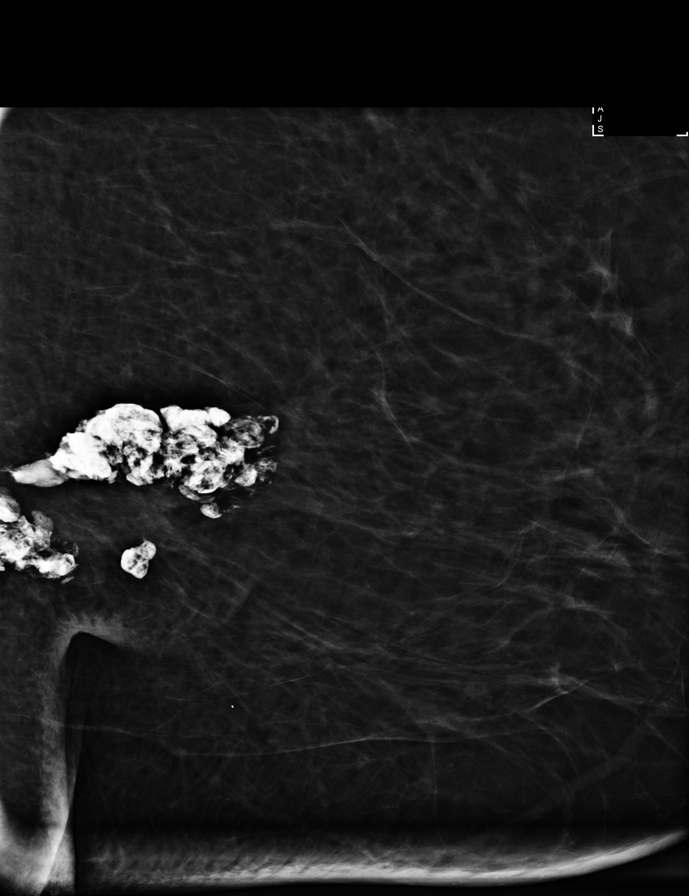

[L MLO (2 of 3)]
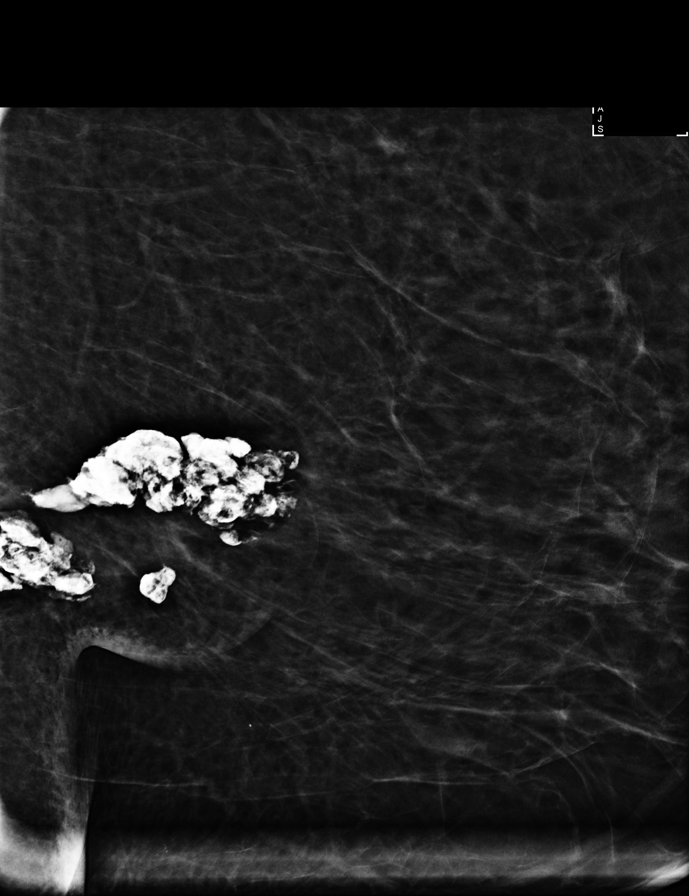

[R CC]
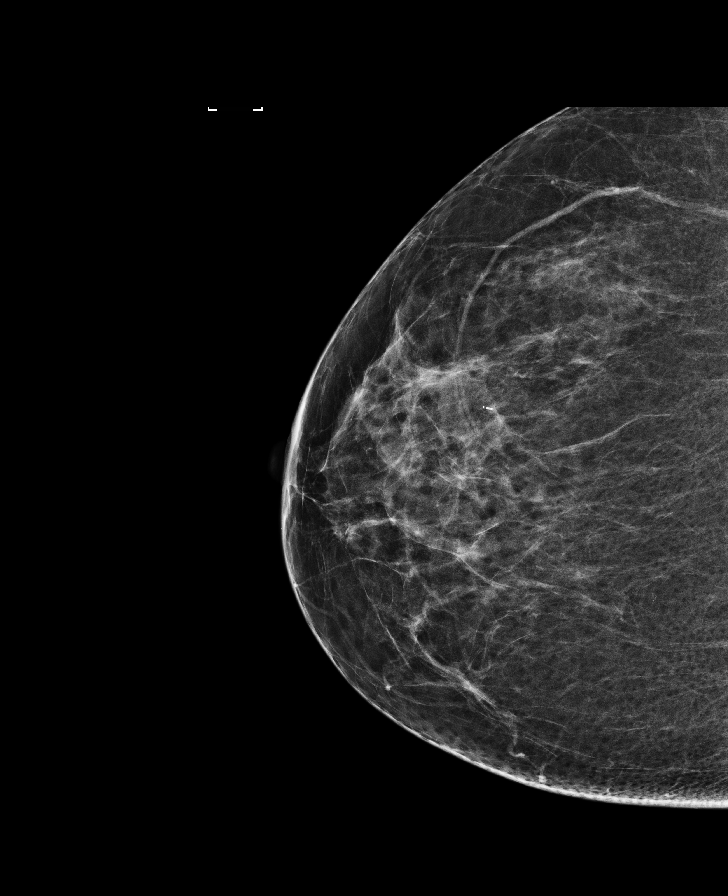

[R CC synth-2D]
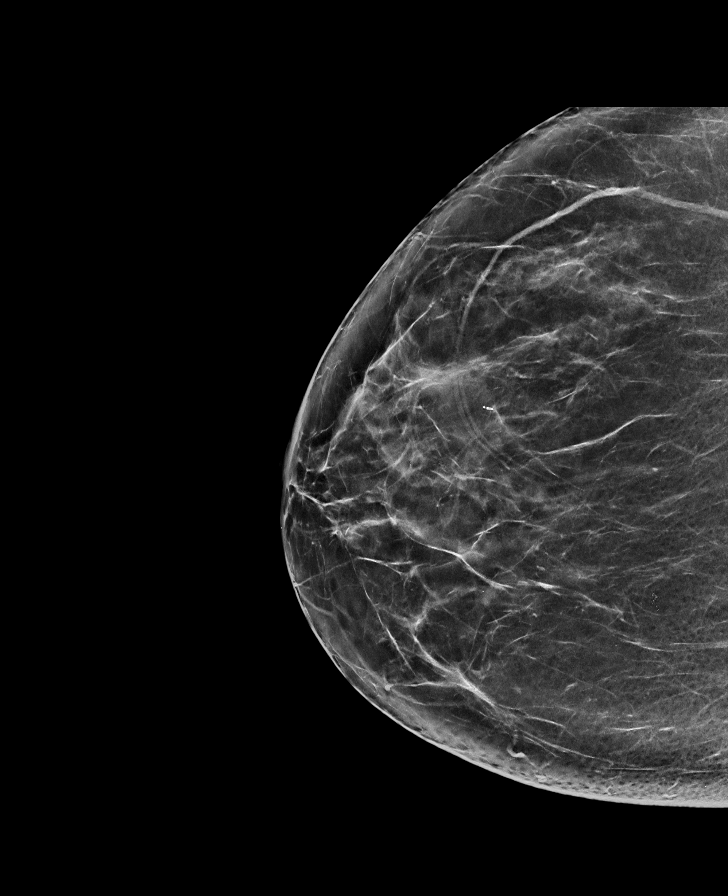

[L MLO (3 of 3)]
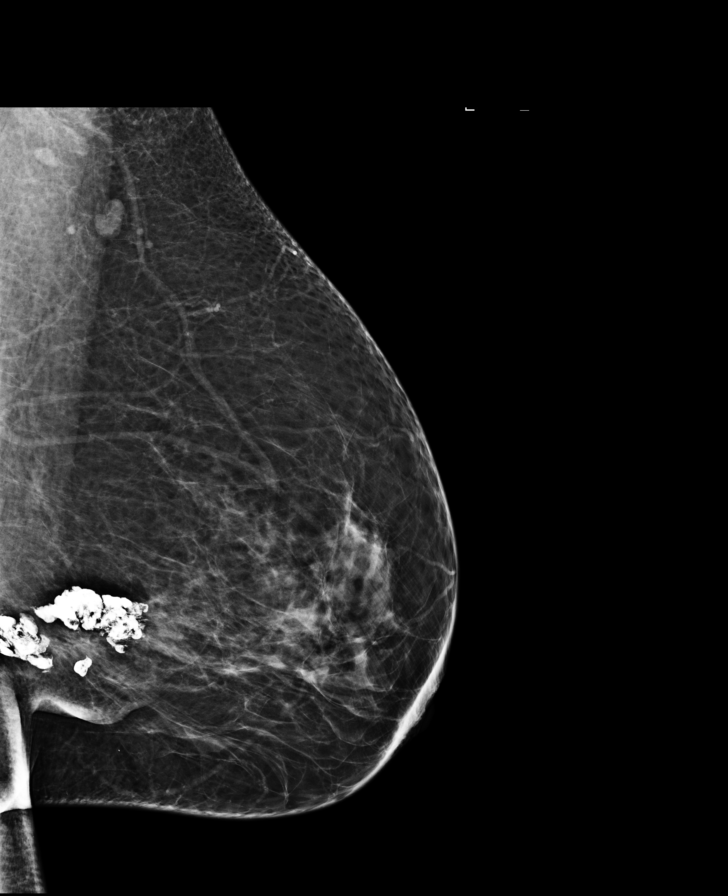

[R MLO synth-2D]
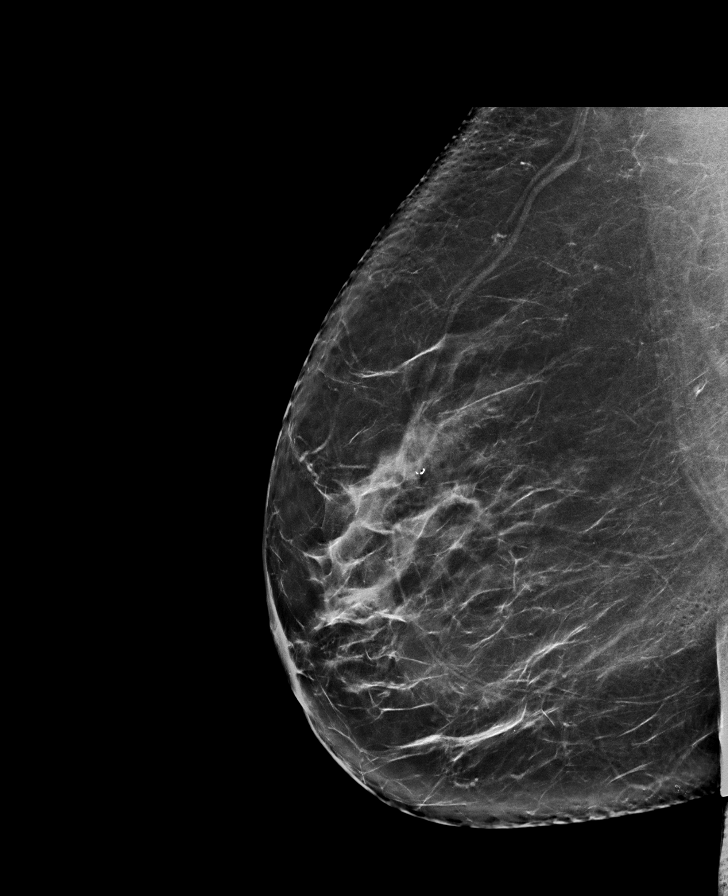

[L CC]
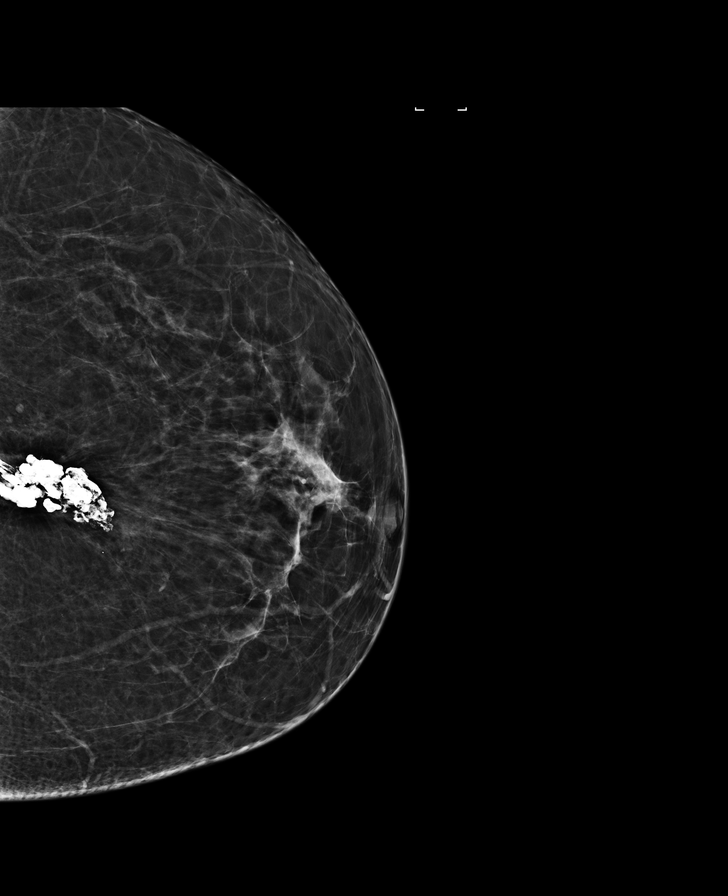

[8 of 31 positions shown; findings below may reference images not displayed]

ACR Breast Density Category b: There are scattered areas of
fibroglandular density.
FINDINGS: Mammographically, there are no suspicious masses, areas of
nonsurgical architectural distortion or microcalcifications in
either breast. Postsurgical and posttreatment changes with
dystrophic calcifications in the lumpectomy site, are stable.

Mammographic images were processed with CAD.
IMPRESSION: No mammographic evidence of malignancy in either breast.

RECOMMENDATION:
Screening mammogram in one year.(Code:UM-2-59G)

I have discussed the findings and recommendations with the patient.
Results were also provided in writing at the conclusion of the
visit. If applicable, a reminder letter will be sent to the patient
regarding the next appointment.

BI-RADS CATEGORY  2: Benign.

## 2017-11-15 ENCOUNTER — Other Ambulatory Visit: Payer: Self-pay

## 2017-11-15 DIAGNOSIS — Z1231 Encounter for screening mammogram for malignant neoplasm of breast: Secondary | ICD-10-CM

## 2018-01-10 ENCOUNTER — Ambulatory Visit
Admission: RE | Admit: 2018-01-10 | Discharge: 2018-01-10 | Disposition: A | Discharge status: Home / Self Care | Payer: Managed Care, Other (non HMO) | Source: Ambulatory Visit | Attending: General Surgery | Admitting: General Surgery

## 2018-01-10 DIAGNOSIS — Z1231 Encounter for screening mammogram for malignant neoplasm of breast: Secondary | ICD-10-CM | POA: Diagnosis not present

## 2018-01-13 ENCOUNTER — Other Ambulatory Visit: Payer: Self-pay

## 2018-01-13 ENCOUNTER — Encounter: Payer: Self-pay | Admitting: General Surgery

## 2018-01-13 ENCOUNTER — Ambulatory Visit: Payer: Managed Care, Other (non HMO) | Admitting: General Surgery

## 2018-01-13 VITALS — BP 138/82 | HR 79 | Temp 98.1°F | Resp 12 | Ht 67.0 in | Wt 175.0 lb

## 2018-01-13 DIAGNOSIS — C50512 Malignant neoplasm of lower-outer quadrant of left female breast: Secondary | ICD-10-CM | POA: Diagnosis not present

## 2018-01-13 DIAGNOSIS — Z170 Estrogen receptor positive status [ER+]: Secondary | ICD-10-CM

## 2018-01-13 NOTE — Progress Notes (Signed)
Patient ID: Chelsea Velasquez, female   DOB: Sep 07, 1960, 57 y.o.   MRN: [id number]  Chief Complaint  Patient presents with  . Follow-up    HPI Chelsea Velasquez is a 57 y.o. female.  who presents for a left breast cancer follow up evaluation. The most recent mammogram was done on 01/04/2017.  Patient does perform regular self breast checks and gets regular mammograms done.  No new breast issues, chronic left breast pain that comes and go.  HPI  Past Medical History:  Diagnosis Date  . Breast cancer ([place]) 2010   left  . Cancer [facility]) 2010   left lumpectomy with mammosite  . 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 Cellulitis and abscess of trunk   . Inflammatory disease of breast 2012  . Liver problem   . Lump or mass in breast 2010   left breast  . Personal history of radiation therapy   . Tendonitis 2011   left shoulder    Past Surgical History:  Procedure Laterality Date  . ABDOMINAL HYSTERECTOMY  1985  . BREAST BIOPSY Left 12/26/2008   invasive ductal carcinoma  . BREAST CYST ASPIRATION Left 08/2012   benign done in Dr Angie Fava office  . BREAST LUMPECTOMY Left 2010   positive  . BREAST SURGERY Left 2010   lumpectomy  . COLONOSCOPY  2013  . TONSILLECTOMY      Family History  Problem Relation Age of Onset  . Heart disease Father        died age 54  . Breast cancer Paternal Grandmother 102  . Cancer Brother 48       throat    Social History Social History   Tobacco Use  . Smoking status: Never Smoker  . Smokeless tobacco: Never Used  Substance Use Topics  . Alcohol use: Yes    Alcohol/week: 3.0 standard drinks    Types: 3 Glasses of wine per week  . Drug use: No    Allergies  Allergen Reactions  . Doxycycline Other (See Comments)    Caused wheezing, nausea, vomiting, elevated BP, dizziness    Current Outpatient Medications  Medication Sig Dispense Refill  . alendronate (FOSAMAX) 70 MG tablet Take 1 tablet (70 mg total) by mouth every 7  (seven) days. Take with a full glass of water on an empty stomach. 4 tablet 11  . azelastine (ASTELIN) 0.1 % nasal spray Place 2 sprays into both nostrils 2 (two) times daily. Use in each nostril as directed 30 mL 12  . Calcium Carbonate-Vitamin D 600-400 MG-UNIT tablet Take by mouth.    . fluticasone (FLONASE) 50 MCG/ACT nasal spray Place into the nose.    . naproxen sodium (ALEVE) 220 MG tablet Take by mouth.    . Vitamin D, Ergocalciferol, (DRISDOL) 50000 units CAPS capsule Take 1 capsule (50,000 Units total) by mouth every 7 (seven) days. 4 capsule 5   No current facility-administered medications for this visit.     Review of Systems Review of Systems  Constitutional: Negative.   Respiratory: Negative.   Cardiovascular: Negative.     Blood pressure 138/82, pulse 79, temperature 98.1 F (36.7 C), temperature source Skin, resp. rate 12, height 5\' 7"  (1.702 m), weight 175 lb (79.4 kg), SpO2 97 %.  Physical Exam Physical Exam Exam conducted with a chaperone present.  Constitutional:      Appearance: She is well-developed.  Eyes:     General: No scleral icterus.    Conjunctiva/sclera:  Conjunctivae normal.  Neck:     Musculoskeletal: Neck supple.  Cardiovascular:     Rate and Rhythm: Normal rate and regular rhythm.     Heart sounds: Normal heart sounds.  Pulmonary:     Effort: Pulmonary effort is normal.     Breath sounds: Normal breath sounds.  Chest:     Breasts:        Right: No inverted nipple, mass, nipple discharge, skin change or tenderness.        Left: No inverted nipple, mass, nipple discharge, skin change or tenderness.       Comments: Left breast lumpectomy  Lymphadenopathy:     Cervical: No cervical adenopathy.  Skin:    General: Skin is warm and dry.  Neurological:     Mental Status: She is alert and oriented to person, place, and time.  Psychiatric:        Behavior: Behavior normal.     Data Reviewed Bone density of December 09, 2015 showed mild  progression from osteopenia to osteoporosis, absolute change -0.1.  Bilateral screening mammograms dated January 10, 2018 were reviewed.  Postsurgical changes with extensive scarring in the inferior aspect of the breast consistent with clinical exam.  BI-RADS-1.  Assessment    No evidence of recurrent cancer.  Due for repeat bone density with PCP.  Modest breast asymmetry secondary to dense scarring in the inferior aspect of the treated breast.    Plan    Consider plastic surgery assessment, the patient will call back if she desires referral.  Recommend increasing Calcium to 2 tablets daily for total 1200 mg.  Patient will be asked to return to the office in one year with a bilateral screening mammogram.      HPI, Physical Exam, Assessment and Plan have been scribed under the direction and in the presence of Robert Bellow, MD. Karie Fetch, RN  I have completed the exam and reviewed the above documentation for accuracy and completeness.  I agree with the above.  Haematologist has been used and any errors in dictation or transcription are unintentional.  Hervey Ard, M.D., F.A.C.S.  Forest Gleason Duell Holdren 01/14/2018, 6:35 AM

## 2018-01-13 NOTE — Patient Instructions (Addendum)
Recommend increasing Calcium to 2 tablets daily for total 1200 mg.  Last Bone density was 12-09-15.  Patient will be asked to return to the office in one year with a bilateral screening mammogram.

## 2018-08-19 ENCOUNTER — Encounter: Payer: Self-pay | Admitting: General Surgery

## 2018-09-21 ENCOUNTER — Other Ambulatory Visit: Payer: Self-pay | Admitting: General Surgery

## 2018-09-21 DIAGNOSIS — N6320 Unspecified lump in the left breast, unspecified quadrant: Secondary | ICD-10-CM

## 2018-09-28 ENCOUNTER — Ambulatory Visit
Admission: RE | Admit: 2018-09-28 | Discharge: 2018-09-28 | Disposition: A | Discharge status: Home / Self Care | Payer: Managed Care, Other (non HMO) | Source: Ambulatory Visit | Attending: General Surgery | Admitting: General Surgery

## 2018-09-28 DIAGNOSIS — N6320 Unspecified lump in the left breast, unspecified quadrant: Secondary | ICD-10-CM

## 2019-09-07 ENCOUNTER — Other Ambulatory Visit: Payer: Self-pay | Admitting: Family Medicine

## 2019-09-07 DIAGNOSIS — Z1231 Encounter for screening mammogram for malignant neoplasm of breast: Secondary | ICD-10-CM

## 2019-10-10 ENCOUNTER — Other Ambulatory Visit: Payer: Self-pay

## 2019-10-10 ENCOUNTER — Ambulatory Visit
Admission: EM | Admit: 2019-10-10 | Discharge: 2019-10-10 | Disposition: A | Discharge status: Home / Self Care | Payer: Managed Care, Other (non HMO) | Attending: Family Medicine | Admitting: Family Medicine

## 2019-10-10 DIAGNOSIS — R1030 Lower abdominal pain, unspecified: Secondary | ICD-10-CM

## 2019-10-10 DIAGNOSIS — R350 Frequency of micturition: Secondary | ICD-10-CM

## 2019-10-10 DIAGNOSIS — R300 Dysuria: Secondary | ICD-10-CM

## 2019-10-10 DIAGNOSIS — R3915 Urgency of urination: Secondary | ICD-10-CM

## 2019-10-10 DIAGNOSIS — R109 Unspecified abdominal pain: Secondary | ICD-10-CM

## 2019-10-10 MED ORDER — CIPROFLOXACIN HCL 500 MG PO TABS: 500.0000 mg | ORAL_TABLET | Freq: Two times a day (BID) | ORAL | 0 refills | Status: DC

## 2019-10-10 MED ORDER — CYCLOBENZAPRINE HCL 10 MG PO TABS: 10.0000 mg | ORAL_TABLET | Freq: Two times a day (BID) | ORAL | 0 refills | Status: DC | PRN

## 2019-10-10 NOTE — ED Provider Notes (Signed)
[facility]   CC: UTI  SUBJECTIVE:  Chelsea Velasquez is a 59 y.o. female who complains of urinary frequency, urgency, bilateral flank pain and dysuria for the past 4 days.  Patient denies a precipitating event, recent sexual encounter, excessive caffeine intake. Localizes the pain to the lower abdomen and bilateral flank. Pain is intermittent and describes it as sharp and burning. Has not tried OTC medications without relief. Reports that she has hx UTIs and has been drinking about 3 gallons of water a day for the last 4-5 days. Symptoms are made worse with urination. Admits to similar symptoms in the past.  Denies fever, chills, nausea, vomiting, abnormal vaginal discharge or bleeding, hematuria.    LMP: No LMP recorded. Patient has had a hysterectomy.  ROS: As in HPI.  All other pertinent ROS negative.     Past Medical History:  Diagnosis Date  . Breast cancer ([place]) 2010   left  . Cancer [facility]) 2010   left lumpectomy with mammosite  . 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 Cellulitis and abscess of trunk   . Inflammatory disease of breast 2012  . Liver problem   . Lump or mass in breast 2010   left breast  . Personal history of radiation therapy    mammosite  . Tendonitis 2011   left shoulder   Past Surgical History:  Procedure Laterality Date  . ABDOMINAL HYSTERECTOMY  1985  . BREAST BIOPSY Left 12/26/2008   invasive ductal carcinoma  . BREAST CYST ASPIRATION Left 08/2012   benign done in Dr Angie Fava office  . BREAST LUMPECTOMY Left 2010   positive  . BREAST SURGERY Left 2010   lumpectomy  . COLONOSCOPY  2013  . TONSILLECTOMY     Allergies  Allergen Reactions  . Doxycycline Other (See Comments)    Caused wheezing, nausea, vomiting, elevated BP, dizziness   No current facility-administered medications on file prior to encounter.   Current Outpatient Medications on File Prior to Encounter  Medication Sig Dispense Refill  . alendronate  (FOSAMAX) 70 MG tablet Take 1 tablet (70 mg total) by mouth every 7 (seven) days. Take with a full glass of water on an empty stomach. 4 tablet 11  . azelastine (ASTELIN) 0.1 % nasal spray Place 2 sprays into both nostrils 2 (two) times daily. Use in each nostril as directed 30 mL 12  . Calcium Carbonate-Vitamin D 600-400 MG-UNIT tablet Take by mouth.    . fluticasone (FLONASE) 50 MCG/ACT nasal spray Place into the nose.    . naproxen sodium (ALEVE) 220 MG tablet Take by mouth.    . Vitamin D, Ergocalciferol, (DRISDOL) 50000 units CAPS capsule Take 1 capsule (50,000 Units total) by mouth every 7 (seven) days. 4 capsule 5   Social History   Socioeconomic History  . Marital status: Single    Spouse name: Not on file  . Number of children: Not on file  . Years of education: Not on file  . Highest education level: Not on file  Occupational History  . Not on file  Tobacco Use  . Smoking status: Never Smoker  . Smokeless tobacco: Never Used  Substance and Sexual Activity  . Alcohol use: Yes    Alcohol/week: 3.0 standard drinks    Types: 3 Glasses of wine per week  . Drug use: No  . Sexual activity: Not Currently    Birth control/protection: None, Surgical  Other Topics Concern  . Not on  file  Social History Narrative  . Not on file   Social Determinants of Health   Financial Resource Strain:   . Difficulty of Paying Living Expenses: Not on file  Food Insecurity:   . Worried About Charity fundraiser in the Last Year: Not on file  . Ran Out of Food in the Last Year: Not on file  Transportation Needs:   . Lack of Transportation (Medical): Not on file  . Lack of Transportation (Non-Medical): Not on file  Physical Activity:   . Days of Exercise per Week: Not on file  . Minutes of Exercise per Session: Not on file  Stress:   . Feeling of Stress : Not on file  Social Connections:   . Frequency of Communication with Friends and Family: Not on file  . Frequency of Social Gatherings  with Friends and Family: Not on file  . Attends Religious Services: Not on file  . Active Member of Clubs or Organizations: Not on file  . Attends Archivist Meetings: Not on file  . Marital Status: Not on file  Intimate Partner Violence:   . Fear of Current or Ex-Partner: Not on file  . Emotionally Abused: Not on file  . Physically Abused: Not on file  . Sexually Abused: Not on file   Family History  Problem Relation Age of Onset  . Heart disease Father        died age 54  . Breast cancer Paternal Grandmother 52  . Cancer Brother 48       throat  . Healthy Mother     OBJECTIVE:  Vitals:   10/10/19 1413  BP: 124/84  Pulse: 77  Resp: 18  Temp: 98.6 F (37 C)  TempSrc: Oral  SpO2: 95%   General appearance: AOx3 in no acute distress HEENT: NCAT. Oropharynx clear.  Lungs: clear to auscultation bilaterally without adventitious breath sounds Heart: regular rate and rhythm. Radial pulses 2+ symmetrical bilaterally Abdomen: soft; non-distended; suprapubic tenderness; bowel sounds present; no guarding or rebound tenderness Back:  CVA tenderness present Extremities: no edema; symmetrical with no gross deformities Skin: warm and dry Neurologic: Ambulates from chair to exam table without difficulty Psychological: alert and cooperative; normal mood and affect  Labs Reviewed  URINE CULTURE  POCT URINALYSIS DIP (MANUAL ENTRY)    ASSESSMENT & PLAN:  1. Dysuria   2. Bilateral flank pain   3. Lower abdominal pain   4. Urinary frequency   5. Urinary urgency     Meds ordered this encounter  Medications  . ciprofloxacin (CIPRO) 500 MG tablet    Sig: Take 1 tablet (500 mg total) by mouth 2 (two) times daily.    Dispense:  14 tablet    Refill:  0    Order Specific Question:   Supervising Provider    Answer:   Chase Picket [id number]  . cyclobenzaprine (FLEXERIL) 10 MG tablet    Sig: Take 1 tablet (10 mg total) by mouth 2 (two) times daily as needed for muscle  spasms.    Dispense:  20 tablet    Refill:  0    Order Specific Question:   Supervising Provider    Answer:   Chase Picket [[id number]]    Given clinical presentation we will go ahead and treat Prescribed Cipro Prescribed flexeril Urine culture sent  We will call you with abnormal results that need further treatment Push fluids and get plenty of rest Take antibiotic as directed and to completion  Take pyridium OTC as needed for symptomatic relief Follow up with PCP if symptoms persists Return here or go to ER if you have any new or worsening symptoms such as fever, worsening abdominal pain, nausea/vomiting, flank pain  Outlined signs and symptoms indicating need for more acute intervention Patient verbalized understanding After Visit Summary given     Faustino Congress, NP 10/10/19 1902

## 2019-10-10 NOTE — Discharge Instructions (Addendum)
You may have a urinary tract infection.   I have sent in Cipro for you to take twice a day for 7 days  I have also sent in flexeril for you to take for bladder and lower abdominal spasms. This medication may make you sleepy. Do not drive or operate heavy machinery when taking this medication.  We are going to culture your urine and will call you as soon as we have the results.   Drink plenty of water, 8-10 glasses per day.   Follow up with your primary care provider as needed.   Go to the Emergency Department if you experience severe pain, shortness of breath, high fever, or other concerns.

## 2019-10-10 NOTE — ED Triage Notes (Signed)
Pt is here with vaginal odor, burning and frequency when urinating with mild back pain that started 4 days ago. Pt has taken anything to relieve discomfort.

## 2019-10-20 ENCOUNTER — Ambulatory Visit
Admission: RE | Admit: 2019-10-20 | Discharge: 2019-10-20 | Disposition: A | Discharge status: Home / Self Care | Payer: Managed Care, Other (non HMO) | Source: Ambulatory Visit | Attending: Family Medicine | Admitting: Family Medicine

## 2019-10-20 ENCOUNTER — Other Ambulatory Visit: Payer: Self-pay

## 2019-10-20 DIAGNOSIS — Z1231 Encounter for screening mammogram for malignant neoplasm of breast: Secondary | ICD-10-CM | POA: Diagnosis not present

## 2020-08-01 IMAGING — US US BREAST*L* LIMITED INC AXILLA
1 series · 5 of 5 positions shown · non-contrast
Comparison: Previous exam(s).

CLINICAL DATA: History of LEFT breast cancer in 6292 status post
lumpectomy and radiation therapy with MammoSite.Ordering physician
describes a palpable lump within the LEFT breast at the 6 o'clock
axis (inframammary fold). Patient describes an additional palpable
area of concern in the upper LEFT breast.

EXAM:
DIGITAL DIAGNOSTIC BILATERAL MAMMOGRAM WITH CAD AND TOMO
ULTRASOUND LEFT BREAST

[Series 1: us breast*left* limited inc axilla · 0.08mm/px · 5 of 5 slices shown]
[im 1/5]
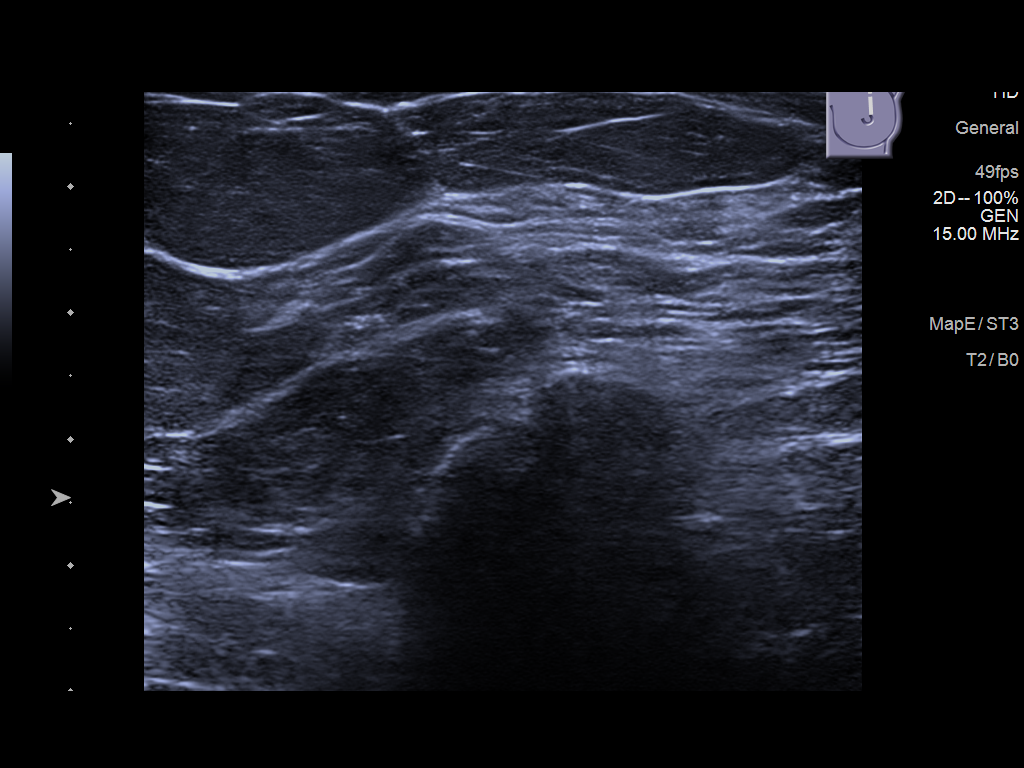
[im 2/5]
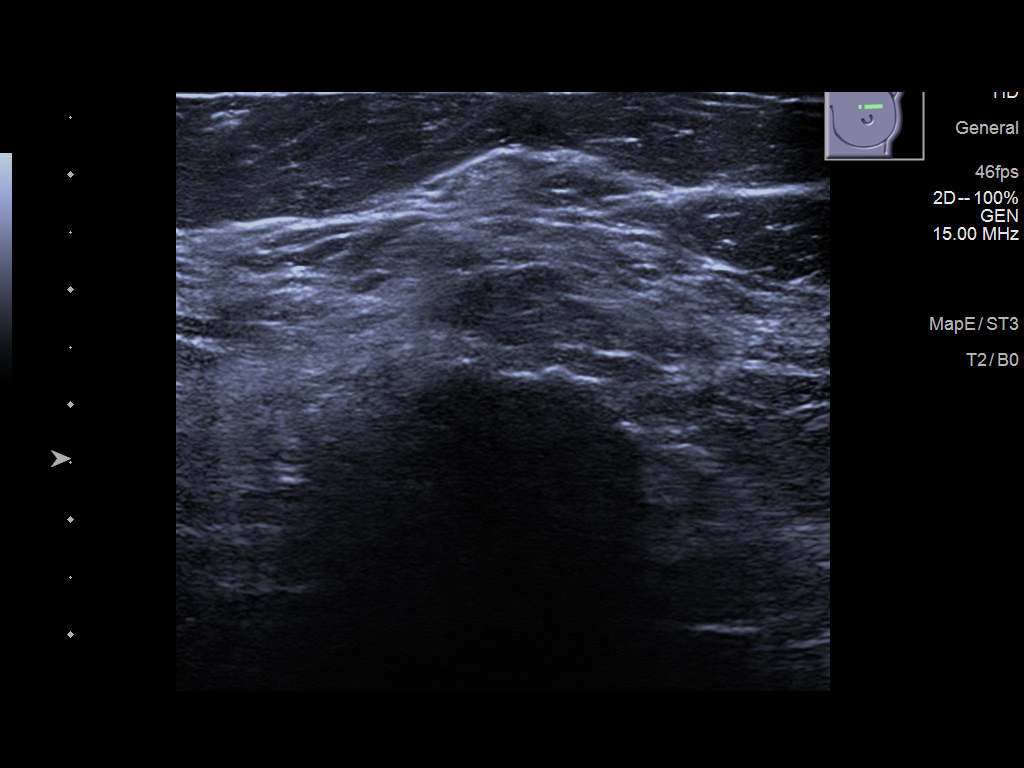
[im 3/5]
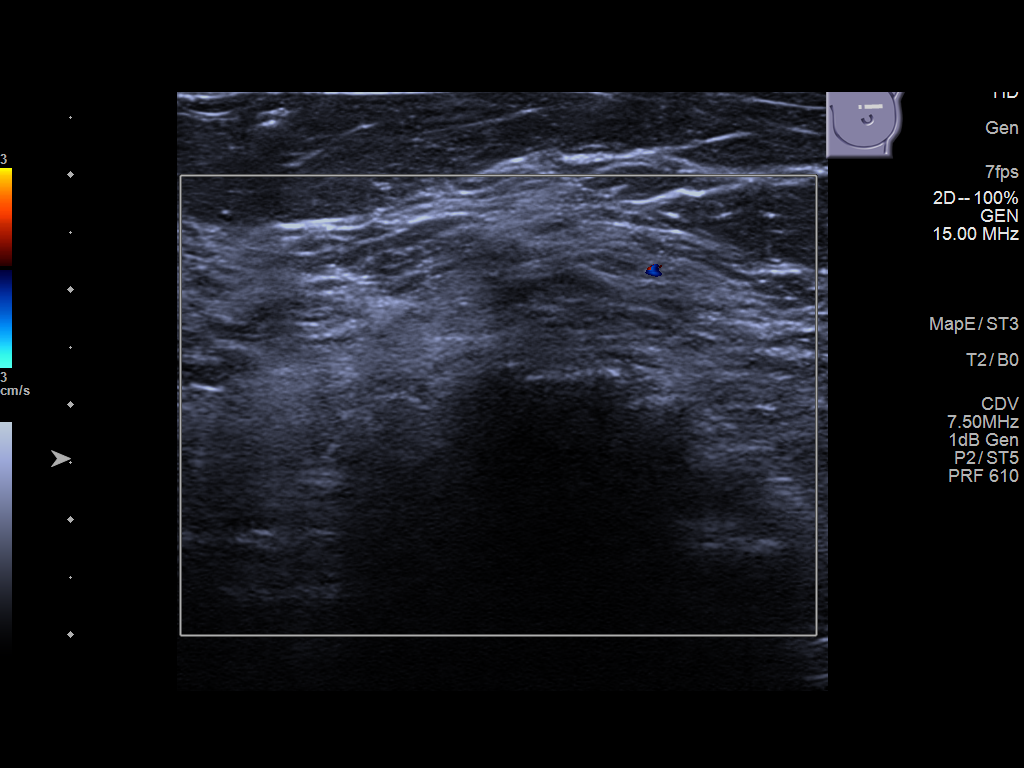
[im 4/5]
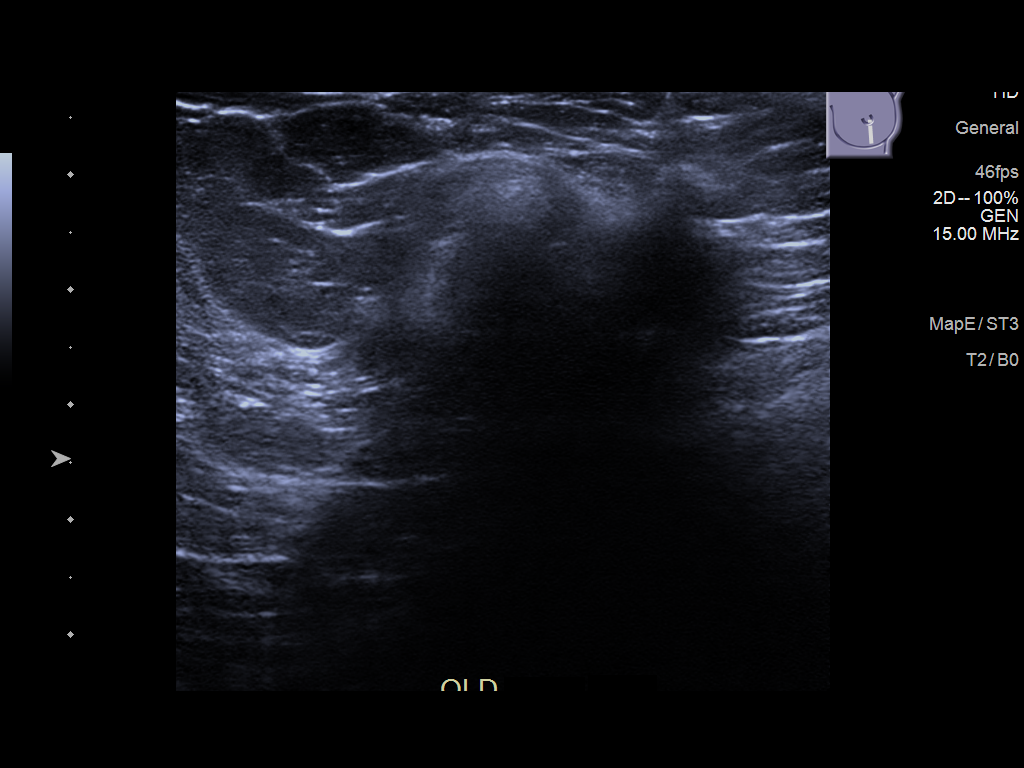
[im 5/5]
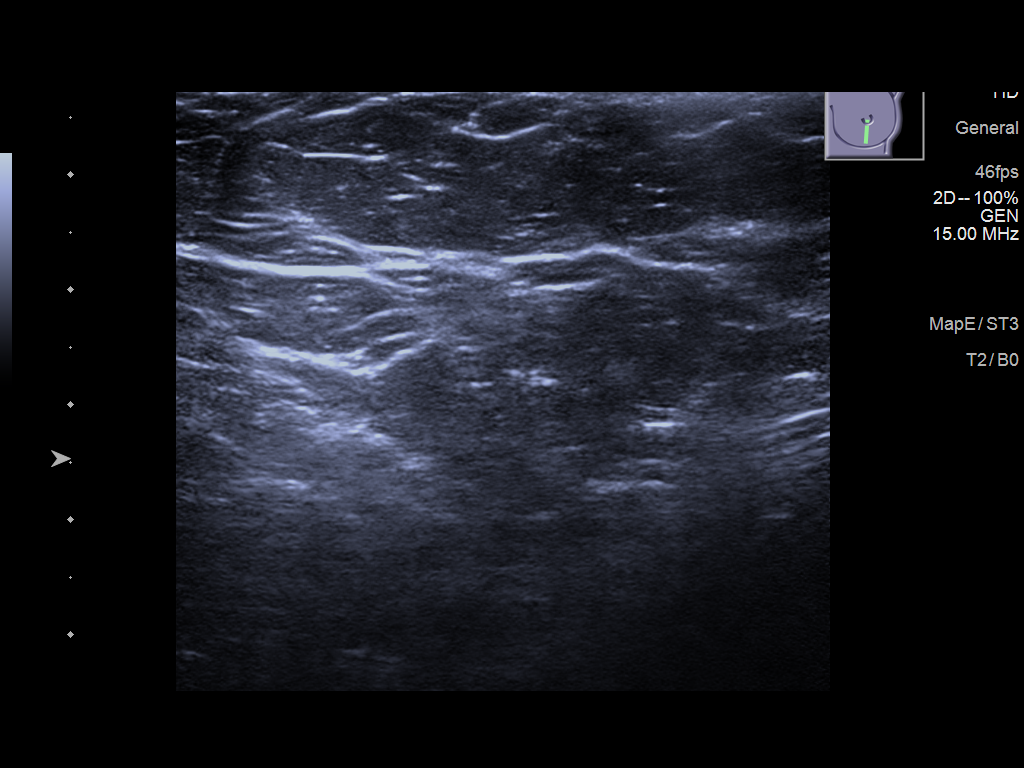

[5 of 5 positions shown; findings below may reference images not displayed]

ACR Breast Density Category b: There are scattered areas of
fibroglandular density.
FINDINGS: There are stable postsurgical changes within the lower LEFT breast,
at posterior depth, with associated large benign dystrophic
calcifications. There are no new dominant masses, suspicious
calcifications or secondary signs of malignancy within the LEFT
breast.

There are no new dominant masses, suspicious calcifications or
secondary signs of malignancy within the RIGHT breast.

Mammographic images were processed with CAD.

Targeted ultrasound is performed, showing the benign shadowing
dystrophic calcification in the lower LEFT breast corresponding to
patient's previous lumpectomy site. The benign dystrophic
calcifications/lumpectomy site is palpated in the lower LEFT breast
in the region of the inframammary fold (as described by the ordering
physician) but can also be palpated from the 12 o'clock axis (as
described by the patient today).

No suspicious solid or cystic mass is identified by ultrasound
within the upper LEFT breast or lower LEFT breast.
IMPRESSION: No evidence of malignancy within either breast. Large benign
dystrophic calcifications at patient's lumpectomy site within the
lower LEFT breast, corresponding to the palpable area of concern.

RECOMMENDATION:
Screening mammogram in one year.(Code:AH-2-52R)

I have discussed the findings and recommendations with the patient.
Results were also provided in writing at the conclusion of the
visit. If applicable, a reminder letter will be sent to the patient
regarding the next appointment.

BI-RADS CATEGORY  2: Benign.

## 2020-09-12 ENCOUNTER — Other Ambulatory Visit: Payer: Self-pay | Admitting: Family Medicine

## 2020-09-16 ENCOUNTER — Other Ambulatory Visit: Payer: Self-pay | Admitting: Family Medicine

## 2020-09-16 DIAGNOSIS — N6320 Unspecified lump in the left breast, unspecified quadrant: Secondary | ICD-10-CM

## 2020-10-21 ENCOUNTER — Ambulatory Visit
Admission: RE | Admit: 2020-10-21 | Discharge: 2020-10-21 | Disposition: A | Discharge status: Home / Self Care | Payer: Managed Care, Other (non HMO) | Source: Ambulatory Visit | Attending: Family Medicine | Admitting: Family Medicine

## 2020-10-21 ENCOUNTER — Other Ambulatory Visit: Payer: Self-pay

## 2020-10-21 DIAGNOSIS — N6320 Unspecified lump in the left breast, unspecified quadrant: Secondary | ICD-10-CM

## 2021-04-03 ENCOUNTER — Other Ambulatory Visit: Payer: Self-pay

## 2021-04-03 ENCOUNTER — Ambulatory Visit: Payer: Managed Care, Other (non HMO) | Admitting: Urology

## 2021-04-03 ENCOUNTER — Encounter: Payer: Self-pay | Admitting: Urology

## 2021-04-03 VITALS — BP 116/73 | HR 85 | Ht 67.0 in | Wt 178.0 lb

## 2021-04-03 DIAGNOSIS — R3129 Other microscopic hematuria: Secondary | ICD-10-CM

## 2021-04-03 DIAGNOSIS — R350 Frequency of micturition: Secondary | ICD-10-CM

## 2021-04-03 DIAGNOSIS — R300 Dysuria: Secondary | ICD-10-CM

## 2021-04-03 DIAGNOSIS — N3941 Urge incontinence: Secondary | ICD-10-CM

## 2021-04-03 LAB — BLADDER SCAN AMB NON-IMAGING: Scan Result: 93

## 2021-04-03 MED ORDER — GEMTESA 75 MG PO TABS: 75.0000 mg | ORAL_TABLET | Freq: Every day | ORAL | 0 refills | Status: DC

## 2021-04-03 NOTE — Patient Instructions (Signed)

## 2021-04-04 LAB — URINALYSIS, COMPLETE
Bilirubin, UA: NEGATIVE
Glucose, UA: NEGATIVE
Ketones, UA: NEGATIVE
Leukocytes,UA: NEGATIVE
Nitrite, UA: NEGATIVE
Protein,UA: NEGATIVE
Specific Gravity, UA: 1.025 (ref 1.005–1.030)
Urobilinogen, Ur: 0.2 mg/dL (ref 0.2–1.0)
pH, UA: 5.5 (ref 5.0–7.5)

## 2021-04-04 LAB — MICROSCOPIC EXAMINATION: Bacteria, UA: NONE SEEN

## 2021-04-04 NOTE — Progress Notes (Signed)
? ?04/03/2021 ?7:38 AM  ? ?Moss Mc ?January 10, 1961 ?[id number] ? ?Referring provider: Dion Body, MD ?[place] ?[facility] ?[place],  [place] [address] ? ?Chief Complaint  ?Patient presents with  ? Dysuria  ? ? ?HPI: ?Chelsea Velasquez is a 61 y.o. female referred for evaluation of dysuria and lower urinary tract symptoms. ? ?1 year history of chronic dysuria associated with urinary frequency, urgency and urge incontinence ?Complains of bladder pressure ?States she has been treated with antibiotics on 2-3 occasions in the last 12 months.  Some visits were urgent care visits. ?She was seen [facility] 02/19/2021 and UA showed 11-30 WBCs.  Urine culture grew 10,000-25,000 CFU Proteus mirabilis ?Denies fever, chills or gross hematuria ?No prior tobacco history ? ?PMH: ?Past Medical History:  ?Diagnosis Date  ? Breast cancer ([place]) 2010  ? left  ? Cancer [facility]) 2010  ? left lumpectomy with mammosite  ? Carcinoma in situ of breast 2010  ? left breast  ? Cellulitis and abscess of trunk   ? Inflammatory disease of breast 2012  ? Liver problem   ? Lump or mass in breast 2010  ? left breast  ? Personal history of radiation therapy   ? mammosite  ? Tendonitis 2011  ? left shoulder  ? ? ?Surgical History: ?Past Surgical History:  ?Procedure Laterality Date  ? ABDOMINAL HYSTERECTOMY  1985  ? BREAST BIOPSY Left 12/26/2008  ? invasive ductal carcinoma  ? BREAST CYST ASPIRATION Left 08/2012  ? benign done in Dr Angie Fava office  ? BREAST LUMPECTOMY Left 2010  ? positive  ? BREAST SURGERY Left 2010  ? lumpectomy  ? COLONOSCOPY  2013  ? TONSILLECTOMY    ? ? ?Home Medications:  ?Allergies as of 04/03/2021   ? ?   Reactions  ? Doxycycline Other (See Comments)  ? Caused wheezing, nausea, vomiting, elevated BP, dizziness  ? ?  ? ?  ?Medication List  ?  ? ?  ? Accurate as of April 03, 2021 11:59 PM. If you have any questions, ask your nurse or doctor.  ?  ?  ? ?  ? ?alendronate 70 MG tablet ?Commonly known as:  FOSAMAX ?Take 1 tablet (70 mg total) by mouth every 7 (seven) days. Take with a full glass of water on an empty stomach. ?  ?azelastine 0.1 % nasal spray ?Commonly known as: ASTELIN ?Place 2 sprays into both nostrils 2 (two) times daily. Use in each nostril as directed ?  ?Calcium Carbonate-Vitamin D 600-400 MG-UNIT tablet ?Take by mouth. ?  ?ciprofloxacin 500 MG tablet ?Commonly known as: CIPRO ?Take 1 tablet (500 mg total) by mouth 2 (two) times daily. ?  ?cyclobenzaprine 10 MG tablet ?Commonly known as: FLEXERIL ?Take 1 tablet (10 mg total) by mouth 2 (two) times daily as needed for muscle spasms. ?  ?fluticasone 50 MCG/ACT nasal spray ?Commonly known as: FLONASE ?Place into the nose. ?  ?Gemtesa 75 MG Tabs ?Generic drug: Vibegron ?Take 75 mg by mouth daily. ?Started by: Abbie Sons, MD ?  ?naproxen sodium 220 MG tablet ?Commonly known as: ALEVE ?Take by mouth. ?  ?Vitamin D (Ergocalciferol) 1.25 MG (50000 UNIT) Caps capsule ?Commonly known as: DRISDOL ?Take 1 capsule (50,000 Units total) by mouth every 7 (seven) days. ?  ? ?  ? ? ?Allergies:  ?Allergies  ?Allergen Reactions  ? Doxycycline Other (See Comments)  ?  Caused wheezing, nausea, vomiting, elevated BP, dizziness  ? ? ?Family History: ?Family History  ?  Problem Relation Age of Onset  ? Heart disease Father   ?     died age 60  ? Breast cancer Paternal Grandmother 79  ? Cancer Brother 48  ?     throat  ? Healthy Mother   ? ? ?Social History:  reports that she has never smoked. She has never used smokeless tobacco. She reports current alcohol use of about 3.0 standard drinks per week. She reports that she does not use drugs. ? ? ?Physical Exam: ?BP 116/73   Pulse 85   Ht '5\' 7"'$  (1.702 m)   Wt 178 lb (80.7 kg)   BMI 27.88 kg/m?   ?Constitutional:  Alert and oriented, No acute distress. ?HEENT: [place] AT, moist mucus membranes.  Trachea midline, no masses. ?Cardiovascular: No clubbing, cyanosis, or edema. ?Respiratory: Normal respiratory effort, no  increased work of breathing. ?Neurologic: Grossly intact, no focal deficits, moving all 4 extremities. ?Psychiatric: Normal mood and affect. ? ?Laboratory Data: ? ?Urinalysis ?Dipstick 1+ blood ?Microscopy 3-10 RBC ? ? ?Assessment & Plan:   ?61 y.o. female with chronic dysuria, frequency, urgency and urge incontinence x1 year ?1 urine culture positive for Proteus ?UA today with microscopic hematuria ?We discussed the association of Proteus bacteriuria with urinary tract stones ?Recommend scheduling CT urogram and cystoscopy for further evaluation.  The procedures were discussed in detail.  If upper tract imaging shows urinary tract stones she was instructed cystoscopy may not be required in the office setting ?Gemtesa samples x28 days given to assess for improvement in her storage related voiding symptoms ? ? ?Abbie Sons, MD ? ?[place] ?[address] ?[place], [place] [address] ?[phone] ? ?

## 2021-04-09 LAB — CULTURE, URINE COMPREHENSIVE

## 2021-04-11 ENCOUNTER — Telehealth: Payer: Self-pay | Admitting: *Deleted

## 2021-04-11 ENCOUNTER — Other Ambulatory Visit: Payer: Self-pay | Admitting: *Deleted

## 2021-04-11 MED ORDER — AMOXICILLIN-POT CLAVULANATE 875-125 MG PO TABS: 1.0000 | ORAL_TABLET | Freq: Two times a day (BID) | ORAL | 0 refills | Status: AC

## 2021-04-11 MED ORDER — AMOXICILLIN-POT CLAVULANATE 875-125 MG PO TABS: 1.0000 | ORAL_TABLET | Freq: Two times a day (BID) | ORAL | 0 refills | Status: DC

## 2021-04-11 NOTE — Telephone Encounter (Signed)
Spoke with patient and advised results rx sent to pharmacy by e-script  

## 2021-04-11 NOTE — Telephone Encounter (Signed)
-----   Message from Abbie Sons, MD sent at 04/10/2021 10:09 PM EDT ----- ?Urine culture grew a low level of strep.  Please send in Rx amoxicillin 875 mg twice daily x7 days.  Keep follow-up as scheduled ?

## 2021-04-16 ENCOUNTER — Ambulatory Visit
Admission: RE | Admit: 2021-04-16 | Discharge: 2021-04-16 | Disposition: A | Discharge status: Home / Self Care | Payer: Managed Care, Other (non HMO) | Source: Ambulatory Visit | Attending: Urology | Admitting: Urology

## 2021-04-16 ENCOUNTER — Other Ambulatory Visit: Payer: Self-pay

## 2021-04-16 DIAGNOSIS — R3129 Other microscopic hematuria: Secondary | ICD-10-CM | POA: Insufficient documentation

## 2021-04-16 DIAGNOSIS — R300 Dysuria: Secondary | ICD-10-CM | POA: Insufficient documentation

## 2021-04-16 DIAGNOSIS — R350 Frequency of micturition: Secondary | ICD-10-CM | POA: Insufficient documentation

## 2021-04-16 DIAGNOSIS — N3941 Urge incontinence: Secondary | ICD-10-CM | POA: Insufficient documentation

## 2021-04-16 LAB — POCT I-STAT CREATININE: Creatinine, Ser: 0.8 mg/dL (ref 0.44–1.00)

## 2021-04-16 MED ORDER — IOHEXOL 300 MG/ML  SOLN
100.0000 mL | Freq: Once | INTRAMUSCULAR | Status: AC | PRN
  Administered 2021-04-16: 100 mL via INTRAVENOUS

## 2021-04-24 ENCOUNTER — Telehealth: Payer: Self-pay | Admitting: Urology

## 2021-04-24 NOTE — Telephone Encounter (Signed)
CT showed no renal masses, kidney blockage or stones.  She may have a diverticulum of the urethra but this will be evaluated at the time of cystoscopy.  Recommend keeping cystoscopy appointment. ?

## 2021-04-24 NOTE — Telephone Encounter (Signed)
Patient notified and voiced understanding.

## 2021-05-08 ENCOUNTER — Ambulatory Visit: Payer: Managed Care, Other (non HMO) | Admitting: Urology

## 2021-05-08 ENCOUNTER — Encounter: Payer: Self-pay | Admitting: Urology

## 2021-05-08 VITALS — BP 144/76 | HR 80 | Ht 67.0 in | Wt 175.0 lb

## 2021-05-08 DIAGNOSIS — R3915 Urgency of urination: Secondary | ICD-10-CM | POA: Diagnosis not present

## 2021-05-08 DIAGNOSIS — R3129 Other microscopic hematuria: Secondary | ICD-10-CM | POA: Diagnosis not present

## 2021-05-08 DIAGNOSIS — R300 Dysuria: Secondary | ICD-10-CM | POA: Diagnosis not present

## 2021-05-08 DIAGNOSIS — R350 Frequency of micturition: Secondary | ICD-10-CM | POA: Diagnosis not present

## 2021-05-08 DIAGNOSIS — R102 Pelvic and perineal pain: Secondary | ICD-10-CM

## 2021-05-08 MED ORDER — MIRABEGRON ER 25 MG PO TB24: 25.0000 mg | ORAL_TABLET | Freq: Every day | ORAL | 0 refills | Status: DC

## 2021-05-08 NOTE — Progress Notes (Signed)
? ?  05/08/21 ? ?CC:  ?Chief Complaint  ?Patient presents with  ? Cysto  ? ? ?HPI: 61 y.o. female initially seen 04/03/2021 for chronic dysuria, frequency, urgency and urge incontinence.  UA at that visit with microhematuria.  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  She had to stop Gemtesa after 2 weeks secondary to side effects UA today negative RBCs on microscopy. ? ?Blood pressure (!) 144/76, pulse 80, height '5\' 7"'$  (1.702 m), weight 175 lb (79.4 kg). ?NED. A&Ox3.   ?No respiratory distress   ?Abd soft, NT, ND ?Normal external genitalia with patent urethral meatus ? ?Cystoscopy Procedure Note ? ?Patient identification was confirmed, informed consent was obtained, and patient was prepped using Betadine solution.  Lidocaine jelly was administered per urethral meatus.   ? ?Procedure: ?- Flexible cystoscope introduced, without any difficulty.   ?- Thorough search of the bladder revealed: ?   normal urethral meatus; no definite diverticulum identified ?   normal urothelium ?   no stones ?   no ulcers  ?   no tumors ?   no urethral polyps ?   no trabeculation ? ?- Ureteral orifices were normal in position and appearance. ? ?Post-Procedure: ?- Patient tolerated the procedure well ? 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  MR pelvis recommended by radiologist for further evaluation and ordered.  Will call with results ?Trial Myrbetriq 25 mg for her voiding symptoms ? ? ? ?Abbie Sons, MD ? ?

## 2021-05-09 ENCOUNTER — Other Ambulatory Visit: Payer: Managed Care, Other (non HMO) | Admitting: Urology

## 2021-05-09 LAB — URINALYSIS, COMPLETE
Bilirubin, UA: NEGATIVE
Glucose, UA: NEGATIVE
Ketones, UA: NEGATIVE
Leukocytes,UA: NEGATIVE
Nitrite, UA: NEGATIVE
Protein,UA: NEGATIVE
Specific Gravity, UA: 1.015 (ref 1.005–1.030)
Urobilinogen, Ur: 0.2 mg/dL (ref 0.2–1.0)
pH, UA: 6 (ref 5.0–7.5)

## 2021-05-09 LAB — MICROSCOPIC EXAMINATION: Bacteria, UA: NONE SEEN

## 2021-05-14 ENCOUNTER — Encounter: Payer: Self-pay | Admitting: *Deleted

## 2021-05-14 ENCOUNTER — Encounter: Payer: Self-pay | Admitting: Urology

## 2021-05-14 ENCOUNTER — Telehealth: Payer: Self-pay | Admitting: Urology

## 2021-05-14 DIAGNOSIS — N390 Urinary tract infection, site not specified: Secondary | ICD-10-CM

## 2021-05-14 DIAGNOSIS — R102 Pelvic and perineal pain: Secondary | ICD-10-CM

## 2021-05-14 NOTE — Telephone Encounter (Signed)
Please let patient know I reviewed CT with colleague and MRI of the pelvis recommended to evaluate for possible urethral diverticulum which we discussed at last office visit.  Order was entered and will call with results. ?

## 2021-06-10 ENCOUNTER — Ambulatory Visit
Admission: RE | Admit: 2021-06-10 | Discharge: 2021-06-10 | Disposition: A | Discharge status: Home / Self Care | Payer: Managed Care, Other (non HMO) | Source: Ambulatory Visit | Attending: Urology | Admitting: Urology

## 2021-06-10 DIAGNOSIS — N390 Urinary tract infection, site not specified: Secondary | ICD-10-CM | POA: Insufficient documentation

## 2021-06-10 DIAGNOSIS — R102 Pelvic and perineal pain: Secondary | ICD-10-CM | POA: Insufficient documentation

## 2021-06-10 MED ORDER — GADOBUTROL 1 MMOL/ML IV SOLN
7.5000 mL | Freq: Once | INTRAVENOUS | Status: AC | PRN
  Administered 2021-06-10: 7.5 mL via INTRAVENOUS

## 2021-06-12 ENCOUNTER — Encounter: Payer: Self-pay | Admitting: Urology

## 2021-09-22 ENCOUNTER — Other Ambulatory Visit: Payer: Self-pay | Admitting: Family Medicine

## 2021-09-22 DIAGNOSIS — Z1231 Encounter for screening mammogram for malignant neoplasm of breast: Secondary | ICD-10-CM

## 2021-10-22 ENCOUNTER — Ambulatory Visit
Admission: RE | Admit: 2021-10-22 | Discharge: 2021-10-22 | Disposition: A | Discharge status: Home / Self Care | Payer: Managed Care, Other (non HMO) | Source: Ambulatory Visit | Attending: Family Medicine | Admitting: Family Medicine

## 2021-10-22 DIAGNOSIS — Z1231 Encounter for screening mammogram for malignant neoplasm of breast: Secondary | ICD-10-CM | POA: Diagnosis present

## 2021-10-25 ENCOUNTER — Ambulatory Visit
Admission: EM | Admit: 2021-10-25 | Discharge: 2021-10-25 | Disposition: A | Discharge status: Home / Self Care | Payer: Managed Care, Other (non HMO) | Attending: Physician Assistant | Admitting: Physician Assistant

## 2021-10-25 DIAGNOSIS — R300 Dysuria: Secondary | ICD-10-CM

## 2021-10-25 DIAGNOSIS — N3001 Acute cystitis with hematuria: Secondary | ICD-10-CM

## 2021-10-25 LAB — URINALYSIS, ROUTINE W REFLEX MICROSCOPIC
Bilirubin Urine: NEGATIVE
Glucose, UA: NEGATIVE mg/dL
Ketones, ur: NEGATIVE mg/dL
Nitrite: NEGATIVE
Protein, ur: NEGATIVE mg/dL
Specific Gravity, Urine: 1.015 (ref 1.005–1.030)
pH: 7.5 (ref 5.0–8.0)

## 2021-10-25 LAB — URINALYSIS, MICROSCOPIC (REFLEX): WBC, UA: >50 WBC/hpf (ref 0–5)

## 2021-10-25 MED ORDER — NITROFURANTOIN MONOHYD MACRO 100 MG PO CAPS: 100.0000 mg | ORAL_CAPSULE | Freq: Two times a day (BID) | ORAL | 0 refills | Status: DC

## 2021-10-25 NOTE — ED Provider Notes (Signed)
[facility]    CSN: [id number] Arrival date & time: 10/25/21  0808      History   Chief Complaint Chief Complaint  Patient presents with   Urinary Tract Infection    HPI Chelsea Velasquez is a 61 y.o. female presenting for 2-day history of urinary frequency, urgency and burning with urination.  Denies fever, fatigue, abdominal pain, flank pain, hematuria no reported vaginal discharge.  Patient believes she has UTI.  States she has had them in the past and symptoms are consistent with that.  She has not taken any OTC meds yet but plans to pick up AZO after she leaves urgent care.  No recent UTIs in the past couple months.  HPI  Past Medical History:  Diagnosis Date   Breast cancer ([place]) 2010   left   Cancer ([place]) 2010   left lumpectomy with mammosite   Carcinoma in situ of breast 2010   left breast   Cellulitis and abscess of trunk    Inflammatory disease of breast 2012   Liver problem    Lump or mass in breast 2010   left breast   Personal history of radiation therapy    mammosite   Tendonitis 2011   left shoulder    Patient Active Problem List   Diagnosis Date Noted   Breast CA ([place]) 12/23/2015   Clinical depression 12/23/2015   Cannot sleep 12/23/2015   Headache, migraine 12/23/2015   Osteopenia 12/23/2015   Avitaminosis D 12/23/2015   History of breast cancer 07/12/2012   Bell palsy 12/15/2007   Allergic rhinitis 12/21/2005   Hypercholesteremia 09/24/2005    Past Surgical History:  Procedure Laterality Date   ABDOMINAL HYSTERECTOMY  1985   BREAST BIOPSY Left 12/26/2008   invasive ductal carcinoma   BREAST CYST ASPIRATION Left 08/2012   benign done in Dr Angie Fava office   BREAST LUMPECTOMY Left 2010   positive   BREAST SURGERY Left 2010   lumpectomy   COLONOSCOPY  2013   TONSILLECTOMY      OB History     Gravida  1   Para  1   Term      Preterm      AB      Living         SAB      IAB      Ectopic      Multiple       Live Births           Obstetric Comments  1st Menstrual Cycle:  16  1st Pregnancy:  20          Home Medications    Prior to Admission medications   Medication Sig Start Date End Date Taking? Authorizing Provider  Calcium Carbonate-Vitamin D 600-400 MG-UNIT tablet Take by mouth.   Yes [provider]  mirabegron ER (MYRBETRIQ) 25 MG TB24 tablet Take 1 tablet (25 mg total) by mouth daily. 05/08/21  Yes Stoioff, Ronda Fairly, MD  naproxen sodium (ALEVE) 220 MG tablet Take by mouth.   Yes [provider]    Family History Family History  Problem Relation Age of Onset   Heart disease Father        died age 45   Breast cancer Paternal Grandmother 27   Cancer Brother 35       throat   Healthy Mother     Social History Social History   Tobacco Use   Smoking status: Never   Smokeless  tobacco: Never  Vaping Use   Vaping Use: Never used  Substance Use Topics   Alcohol use: Yes    Alcohol/week: 3.0 standard drinks of alcohol    Types: 3 Glasses of wine per week   Drug use: No     Allergies   Doxycycline   Review of Systems Review of Systems  Constitutional:  Negative for chills, fatigue and fever.  Gastrointestinal:  Negative for abdominal pain, diarrhea, nausea and vomiting.  Genitourinary:  Positive for dysuria, frequency and urgency. Negative for decreased urine volume, flank pain, hematuria, pelvic pain, vaginal bleeding, vaginal discharge and vaginal pain.  Musculoskeletal:  Negative for back pain.  Skin:  Negative for rash.     Physical Exam Triage Vital Signs ED Triage Vitals  Enc Vitals Group     BP      Pulse      Resp      Temp      Temp src      SpO2      Weight      Height      Head Circumference      Peak Flow      Pain Score      Pain Loc      Pain Edu?      Excl. in [place]?    No data found.  Updated Vital Signs BP 114/76 (BP Location: Left Arm)   Pulse 79   Temp 98 F (36.7 C) (Oral)   Resp 18   Ht '5\' 7"'$  (1.702  m)   Wt 165 lb (74.8 kg)   SpO2 100%   BMI 25.84 kg/m       Physical Exam Vitals and nursing note reviewed.  Constitutional:      General: She is not in acute distress.    Appearance: Normal appearance. She is not ill-appearing or toxic-appearing.  HENT:     Head: Normocephalic and atraumatic.  Eyes:     General: No scleral icterus.       Right eye: No discharge.        Left eye: No discharge.     Conjunctiva/sclera: Conjunctivae normal.  Cardiovascular:     Rate and Rhythm: Normal rate and regular rhythm.     Heart sounds: Normal heart sounds.  Pulmonary:     Effort: Pulmonary effort is normal. No respiratory distress.     Breath sounds: Normal breath sounds.  Abdominal:     Palpations: Abdomen is soft.     Tenderness: There is abdominal tenderness (suprapubic). There is no right CVA tenderness or left CVA tenderness.  Musculoskeletal:     Cervical back: Neck supple.  Skin:    General: Skin is dry.  Neurological:     General: No focal deficit present.     Mental Status: She is alert. Mental status is at baseline.     Motor: No weakness.     Gait: Gait normal.  Psychiatric:        Mood and Affect: Mood normal.        Behavior: Behavior normal.        Thought Content: Thought content normal.      UC Treatments / Results  Labs (all labs ordered are listed, but only abnormal results are displayed) Labs Reviewed  URINALYSIS, ROUTINE W REFLEX MICROSCOPIC - Abnormal; Notable for the following components:      Result Value   APPearance HAZY (*)    Hgb urine dipstick TRACE (*)    Leukocytes,Ua  MODERATE (*)    All other components within normal limits  URINALYSIS, MICROSCOPIC (REFLEX) - Abnormal; Notable for the following components:   Bacteria, UA MANY (*)    All other components within normal limits  URINE CULTURE    EKG   Radiology No results found.  Procedures Procedures (including critical care time)  Medications Ordered in UC Medications - No data  to display  Initial Impression / Assessment and Plan / UC Course  I have reviewed the triage vital signs and the nursing notes.  Pertinent labs & imaging results that were available during my care of the patient were reviewed by me and considered in my medical decision making (see chart for details).   61 year old female presenting for 2-day history of dysuria, urinary frequency and urgency.  Denies fever, abdominal pain or flank pain.  Vitals are stable.  Exam reveals soft abdomen with mild tenderness palpation of the suprapubic region.  No CVA tenderness.  Urinalysis performed shows hazy urine with trace hemoglobin and moderate leukocytes as well as many bacteria on microscopic analysis.  We will send urine for culture and treat for UTI at this time with Macrobid.  Advised patient we we will contact her with the results of waiting to alter her course of therapy.  Encouraged her to increase rest and fluids and take the AZO for symptoms that she should be feeling better in a couple days.  Reviewed return and ER precautions.   Final Clinical Impressions(s) / UC Diagnoses   Final diagnoses:  None   Discharge Instructions   None    ED Prescriptions   None    PDMP not reviewed this encounter.   Danton Clap, PA-C 10/25/21 [phone]

## 2021-10-25 NOTE — Discharge Instructions (Signed)

## 2021-10-25 NOTE — ED Triage Notes (Signed)
Pt c/o urinary burning, frequency, urgency x2-3days

## 2021-10-28 ENCOUNTER — Telehealth (HOSPITAL_COMMUNITY): Payer: Self-pay | Admitting: Emergency Medicine

## 2021-10-28 LAB — URINE CULTURE: Culture: >=100000 — AB

## 2021-10-28 MED ORDER — CEPHALEXIN 500 MG PO CAPS
500.0000 mg | ORAL_CAPSULE | Freq: Two times a day (BID) | ORAL | 0 refills | Status: AC
Start: 2021-10-28 — End: 2021-11-02

## 2021-12-23 ENCOUNTER — Ambulatory Visit
Admission: EM | Admit: 2021-12-23 | Discharge: 2021-12-23 | Disposition: A | Discharge status: Home / Self Care | Payer: Managed Care, Other (non HMO) | Attending: Emergency Medicine | Admitting: Emergency Medicine

## 2021-12-23 DIAGNOSIS — N3001 Acute cystitis with hematuria: Secondary | ICD-10-CM | POA: Diagnosis present

## 2021-12-23 LAB — URINALYSIS, MICROSCOPIC (REFLEX)

## 2021-12-23 LAB — URINALYSIS, ROUTINE W REFLEX MICROSCOPIC
Bilirubin Urine: NEGATIVE
Glucose, UA: NEGATIVE mg/dL
Ketones, ur: NEGATIVE mg/dL
Nitrite: NEGATIVE
Protein, ur: NEGATIVE mg/dL
Specific Gravity, Urine: 1.015 (ref 1.005–1.030)
pH: 6 (ref 5.0–8.0)

## 2021-12-23 MED ORDER — CEFDINIR 300 MG PO CAPS: 300.0000 mg | ORAL_CAPSULE | Freq: Two times a day (BID) | ORAL | 0 refills | Status: AC

## 2021-12-23 NOTE — ED Triage Notes (Signed)
Patient reports that she has had burning with urination, urinary frequency and pressure -- for about 3 days.

## 2021-12-23 NOTE — ED Provider Notes (Signed)
[facility]    CSN: [id number] Arrival date & time: 12/23/21  6812      History   Chief Complaint Chief Complaint  Patient presents with   Dysuria   Urinary Frequency    HPI Chelsea Velasquez is a 61 y.o. female.   Patient presents today with 3-day history of UTI symptoms including dysuria, urinary frequency, suprapubic discomfort.  Denies any fever, nausea, vomiting, vaginal symptoms.  She has a history of recurrent UTI with similar presentation.  She was last treated 10/25/2021 at which point she was given Macrobid but culture grew E. coli resistant to [place] and so was transitioned to Keflex.  She reports resolution of symptoms with this medication.  She does have a history of kidney stones but current symptoms are similar to previous episodes of nephrolithiasis.  She denies history of diabetes and does not take an SGLT2 inhibitor.  Denies recent urogenital procedure, self-catheterization, single kidney.  She has not seen a urologist but is in the process of establishing with someone given recurrent UTI.    Past Medical History:  Diagnosis Date   Breast cancer ([place]) 2010   left   Cancer ([place]) 2010   left lumpectomy with mammosite   Carcinoma in situ of breast 2010   left breast   Cellulitis and abscess of trunk    Inflammatory disease of breast 2012   Liver problem    Lump or mass in breast 2010   left breast   Personal history of radiation therapy    mammosite   Tendonitis 2011   left shoulder    Patient Active Problem List   Diagnosis Date Noted   Breast CA ([place]) 12/23/2015   Clinical depression 12/23/2015   Cannot sleep 12/23/2015   Headache, migraine 12/23/2015   Osteopenia 12/23/2015   Avitaminosis D 12/23/2015   History of breast cancer 07/12/2012   Bell palsy 12/15/2007   Allergic rhinitis 12/21/2005   Hypercholesteremia 09/24/2005    Past Surgical History:  Procedure Laterality Date   ABDOMINAL HYSTERECTOMY  1985   BREAST BIOPSY Left  12/26/2008   invasive ductal carcinoma   BREAST CYST ASPIRATION Left 08/2012   benign done in Dr Angie Fava office   BREAST LUMPECTOMY Left 2010   positive   BREAST SURGERY Left 2010   lumpectomy   COLONOSCOPY  2013   TONSILLECTOMY      OB History     Gravida  1   Para  1   Term      Preterm      AB      Living         SAB      IAB      Ectopic      Multiple      Live Births           Obstetric Comments  1st Menstrual Cycle:  16  1st Pregnancy:  20          Home Medications    Prior to Admission medications   Medication Sig Start Date End Date Taking? Authorizing Provider  cefdinir (OMNICEF) 300 MG capsule Take 1 capsule (300 mg total) by mouth 2 (two) times daily for 5 days. 12/23/21 12/28/21 Yes Kadience Macchi, Derry Skill, PA-C  Calcium Carbonate-Vitamin D 600-400 MG-UNIT tablet Take by mouth.    [provider]  mirabegron ER (MYRBETRIQ) 25 MG TB24 tablet Take 1 tablet (25 mg total) by mouth daily. 05/08/21   Stoioff, Ronda Fairly, MD  naproxen sodium (ALEVE) 220 MG tablet Take by mouth.    [provider]  nitrofurantoin, macrocrystal-monohydrate, (MACROBID) 100 MG capsule Take 1 capsule (100 mg total) by mouth 2 (two) times daily. 10/25/21   Danton Clap, PA-C    Family History Family History  Problem Relation Age of Onset   Heart disease Father        died age 60   Breast cancer Paternal Grandmother 57   Cancer Brother 47       throat   Healthy Mother     Social History Social History   Tobacco Use   Smoking status: Never   Smokeless tobacco: Never  Vaping Use   Vaping Use: Never used  Substance Use Topics   Alcohol use: Yes    Alcohol/week: 3.0 standard drinks of alcohol    Types: 3 Glasses of wine per week   Drug use: No     Allergies   Doxycycline   Review of Systems Review of Systems  Constitutional:  Positive for activity change. Negative for appetite change, fatigue and fever.  Respiratory:  Negative for cough  and shortness of breath.   Cardiovascular:  Negative for chest pain.  Gastrointestinal:  Negative for abdominal pain, diarrhea, nausea and vomiting.  Genitourinary:  Positive for dysuria, frequency and urgency. Negative for flank pain, hematuria, vaginal bleeding, vaginal discharge and vaginal pain.  Musculoskeletal:  Negative for arthralgias, back pain and myalgias.     Physical Exam Triage Vital Signs ED Triage Vitals  Enc Vitals Group     BP 12/23/21 0847 110/72     Pulse Rate 12/23/21 0847 81     Resp --      Temp 12/23/21 0847 97.7 F (36.5 C)     Temp Source 12/23/21 0847 Oral     SpO2 12/23/21 0847 97 %     Weight 12/23/21 0846 155 lb (70.3 kg)     Height 12/23/21 0846 '5\' 7"'$  (1.702 m)     Head Circumference --      Peak Flow --      Pain Score 12/23/21 0846 6     Pain Loc --      Pain Edu? --      Excl. in [place]? --    No data found.  Updated Vital Signs BP 110/72 (BP Location: Left Arm)   Pulse 81   Temp 97.7 F (36.5 C) (Oral)   Ht '5\' 7"'$  (1.702 m)   Wt 155 lb (70.3 kg)   SpO2 97%   BMI 24.28 kg/m   Visual Acuity Right Eye Distance:   Left Eye Distance:   Bilateral Distance:    Right Eye Near:   Left Eye Near:    Bilateral Near:     Physical Exam Vitals reviewed.  Constitutional:      General: She is awake. She is not in acute distress.    Appearance: Normal appearance. She is well-developed. She is not ill-appearing.     Comments: Very pleasant female appears stated age in no acute distress sitting comfortably in exam room  HENT:     Head: Normocephalic and atraumatic.  Cardiovascular:     Rate and Rhythm: Normal rate and regular rhythm.     Heart sounds: Normal heart sounds, S1 normal and S2 normal. No murmur heard. Pulmonary:     Effort: Pulmonary effort is normal.     Breath sounds: Normal breath sounds. No wheezing, rhonchi or rales.     Comments: Clear to  auscultation bilaterally Abdominal:     General: Bowel sounds are normal.      Palpations: Abdomen is soft.     Tenderness: There is abdominal tenderness in the suprapubic area. There is no right CVA tenderness, left CVA tenderness, guarding or rebound.     Comments: Benign abdominal exam; no evidence of acute infection on physical exam.  No CVA tenderness.  Psychiatric:        Behavior: Behavior is cooperative.      UC Treatments / Results  Labs (all labs ordered are listed, but only abnormal results are displayed) Labs Reviewed  URINALYSIS, ROUTINE W REFLEX MICROSCOPIC - Abnormal; Notable for the following components:      Result Value   APPearance HAZY (*)    Hgb urine dipstick TRACE (*)    Leukocytes,Ua SMALL (*)    All other components within normal limits  URINALYSIS, MICROSCOPIC (REFLEX) - Abnormal; Notable for the following components:   Bacteria, UA FEW (*)    All other components within normal limits  URINE CULTURE    EKG   Radiology No results found.  Procedures Procedures (including critical care time)  Medications Ordered in UC Medications - No data to display  Initial Impression / Assessment and Plan / UC Course  I have reviewed the triage vital signs and the nursing notes.  Pertinent labs & imaging results that were available during my care of the patient were reviewed by me and considered in my medical decision making (see chart for details).     Patient is well-appearing, afebrile, nontoxic, nontachycardic.  No indication for emergent evaluation or imaging based on exam today.  UA did show evidence of infection.  Initially offered Bactrim DS given previous culture which showed E. coli susceptible to this medication, however, patient reports that she does not do well with this medication and declined it.  Will treat with cefdinir 300 mg twice daily for 5 days.  Urine culture was obtained and we will contact her if need to change antibiotics based on bacterial susceptibilities identified on culture.  She is to rest and drink plenty of  fluid.  Recommended she establish with a urologist given recurrent infections.  Discussed that she should follow-up with her primary care within a few weeks for repeat UA to ensure that hematuria noted today resolved with clearing of infection.  If she has any worsening symptoms including abdominal pain, pelvic pain, nausea, vomiting, fever.  Strict return precautions given.  Final Clinical Impressions(s) / UC Diagnoses   Final diagnoses:  Acute cystitis with hematuria     Discharge Instructions      We are treating you for urinary tract infection.  Take cefdinir twice daily for 5 days.  We are going to send a culture for urine and if need to change antibiotics we will contact you in a few days.  Make sure you rest and drink plenty of fluid.  I do think is reasonable to follow-up with urology so contact them to schedule an appointment.  Follow-up with your primary care within a few weeks to ensure that the blood noted in your urine today resolved with clearing of the infection.  If anything changes or worsens and you develop abdominal pain, fever, blood in your urine, nausea/vomiting interfere with oral intake, weakness you need to be seen immediately.     ED Prescriptions     Medication Sig Dispense Auth. Provider   cefdinir (OMNICEF) 300 MG capsule Take 1 capsule (300 mg total) by  mouth 2 (two) times daily for 5 days. 10 capsule Korynne Dols, Derry Skill, PA-C      PDMP not reviewed this encounter.   Terrilee Croak, PA-C 12/23/21 0940

## 2021-12-23 NOTE — Discharge Instructions (Addendum)
We are treating you for urinary tract infection.  Take cefdinir twice daily for 5 days.  We are going to send a culture for urine and if need to change antibiotics we will contact you in a few days.  Make sure you rest and drink plenty of fluid.  I do think is reasonable to follow-up with urology so contact them to schedule an appointment.  Follow-up with your primary care within a few weeks to ensure that the blood noted in your urine today resolved with clearing of the infection.  If anything changes or worsens and you develop abdominal pain, fever, blood in your urine, nausea/vomiting interfere with oral intake, weakness you need to be seen immediately.

## 2021-12-25 ENCOUNTER — Encounter: Payer: Self-pay | Admitting: Urology

## 2021-12-25 LAB — URINE CULTURE: Culture: 70000 — AB

## 2022-02-22 NOTE — Progress Notes (Unsigned)
02/23/2022 2:48 PM   Chelsea Velasquez 25-Feb-1960 [id number]  Referring provider: Dion Body, MD [facility] [place],  [place] [address]  Urological history: 1.  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 Diverticulum  -cysto (04/2021) - NED -pelvic MRI (05/2021) no diverticulum  -no reports of gross heme -UA negative micro heme  2. Renal cysts -CTU (03/2021) - few small benign Bosniak category 1 and 2 renal cysts are again noted.  3. rUTI's -Contributing factors of age, vaginal atrophy -Documented urine cultures are the last year  December 23, 2021 Proteus mirabilis   October 25, 2021 Proteus mirabilis  April 03, 2021 Streptococcus anginosus group     Chief Complaint  Patient presents with   Recurrent UTI    HPI: Chelsea Velasquez is a 62 y.o. female who presents today for possible UTI.   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  She may have to hold her urine for a long time on occasions.  She has not had an association with sexual activity.   For the last two weeks, she has had milky urine associated with painful urination.  She also feels that she has a heaviness in her pelvis and it causes her pain.  She states this has been going on for several weeks since starting to interfere with her job and quality of life.  UA yellow slightly cloudy, specific gravity 1.020, trace blood, pH 6.0, +1 leukocyte, greater than 30 WBCs, 0-2 RBCs, 0-10 epithelial cells, mucus threads are present and moderate bacteria.  Patient denies any modifying or aggravating factors.  Patient denies any gross hematuria or suprapubic/flank pain.  Patient denies any fevers, chills, nausea or vomiting.     PMH: Past Medical History:  Diagnosis Date   Breast cancer ([place]) 2010   left   Cancer ([place]) 2010   left lumpectomy with mammosite   Carcinoma in situ of breast 2010   left breast   Cellulitis and abscess  of trunk    Inflammatory disease of breast 2012   Liver problem    Lump or mass in breast 2010   left breast   Personal history of radiation therapy    mammosite   Tendonitis 2011   left shoulder    Surgical History: Past Surgical History:  Procedure Laterality Date   ABDOMINAL HYSTERECTOMY  1985   BREAST BIOPSY Left 12/26/2008   invasive ductal carcinoma   BREAST CYST ASPIRATION Left 08/2012   benign done in Dr Angie Fava office   BREAST LUMPECTOMY Left 2010   positive   BREAST SURGERY Left 2010   lumpectomy   COLONOSCOPY  2013   TONSILLECTOMY      Home Medications:  Allergies as of 02/23/2022       Reactions   Doxycycline Other (See Comments)   Caused wheezing, nausea, vomiting, elevated BP, dizziness        Medication List        Accurate as of February 23, 2022  2:48 PM. If you have any questions, ask your nurse or doctor.          STOP taking these medications    mirabegron ER 25 MG Tb24 tablet Commonly known as: MYRBETRIQ Stopped by: Khiry Pasquariello, PA-C   nitrofurantoin (macrocrystal-monohydrate) 100 MG capsule Commonly known as: MACROBID Stopped by: Zara Council, PA-C       TAKE these medications    Calcium Carbonate-Vitamin  D 600-400 MG-UNIT tablet Take by mouth.   Gemtesa 75 MG Tabs Generic drug: Vibegron Take 1 tablet (75 mg total) by mouth daily. Started by: Zara Council, PA-C   hyoscyamine 0.125 MG tablet Commonly known as: Levsin Take 1 tablet (0.125 mg total) by mouth every 4 (four) hours as needed. Started by: Zara Council, PA-C   lovastatin 40 MG tablet Commonly known as: MEVACOR TAKE 1 TABLET(40 MG) BY MOUTH AT BEDTIME   naproxen sodium 220 MG tablet Commonly known as: ALEVE Take by mouth.   Prolia 60 MG/ML Sosy injection Generic drug: denosumab INJECT '60MG'$  SUBCUTANEOUSLY  EVERY 6 MONTHS   sulfamethoxazole-trimethoprim 800-160 MG tablet Commonly known as: BACTRIM DS Take 1 tablet by mouth every 12  (twelve) hours. Started by: Zara Council, PA-C   Reagen.Frieze 2.4 MG/0.75ML Soaj Generic drug: Semaglutide-Weight Management SMARTSIG:2.4 Milligram(s) SUB-Q Once a Week        Allergies:  Allergies  Allergen Reactions   Doxycycline Other (See Comments)    Caused wheezing, nausea, vomiting, elevated BP, dizziness    Family History: Family History  Problem Relation Age of Onset   Heart disease Father        died age 63   Breast cancer Paternal Grandmother 41   Cancer Brother 4       throat   Healthy Mother     Social History:  reports that she has never smoked. She has never used smokeless tobacco. She reports current alcohol use of about 3.0 standard drinks of alcohol per week. She reports that she does not use drugs.  ROS: Pertinent ROS in HPI  Physical Exam: BP 134/75   Pulse 69   Ht '5\' 7"'$  (1.702 m)   Wt 161 lb (73 kg)   BMI 25.22 kg/m   Constitutional:  Well nourished. Alert and oriented, No acute distress. HEENT: [place] AT, moist mucus membranes.  Trachea midline Cardiovascular: No clubbing, cyanosis, or edema. Respiratory: Normal respiratory effort, no increased work of breathing. Neurologic: Grossly intact, no focal deficits, moving all 4 extremities. Psychiatric: Normal mood and affect.    Laboratory Data: Serum creatinine (01/2022) 0.78 Hemoglobin A1c (01/2022) 5.3 Urinalysis See EPIC and HPI I have reviewed the labs.   Pertinent Imaging: N/A  Assessment & Plan:    1. rUTI -Recent CT urogram, pelvic MRI and cystoscopy were negative -UA suspicious for infection -Urine sent for culture -Start Septra DS twice daily for 7 days -She has a history of breast cancer, so she is now willing to use vaginal estrogen cream at this time -Once we have determined she does have another infection and we have treated an appropriate lady we will place her on a low-dose antibiotic for 3 months  2.  OAB -Gave samples of Gemtesa 75 mg, #42, to take daily -Difficult to  know whether or not her bladder symptoms are due to recurrent UTIs and will be treated with a prophylactic antibiotic or if the [place] will give her relief -Also given her a prescription for Levsin 0.125 every 4 hours as needed for spasms   Return for pending urine culture results.  These notes generated with voice recognition software. I apologize for typographical errors.  [place], [place] [address]  [place], [place] [address] [phone]

## 2022-02-23 ENCOUNTER — Ambulatory Visit: Payer: Managed Care, Other (non HMO) | Admitting: Urology

## 2022-02-23 ENCOUNTER — Encounter: Payer: Self-pay | Admitting: Urology

## 2022-02-23 VITALS — BP 134/75 | HR 69 | Ht 67.0 in | Wt 161.0 lb

## 2022-02-23 DIAGNOSIS — R3989 Other symptoms and signs involving the genitourinary system: Secondary | ICD-10-CM

## 2022-02-23 DIAGNOSIS — N3941 Urge incontinence: Secondary | ICD-10-CM

## 2022-02-23 DIAGNOSIS — N3281 Overactive bladder: Secondary | ICD-10-CM

## 2022-02-23 MED ORDER — GEMTESA 75 MG PO TABS: 75.0000 mg | ORAL_TABLET | Freq: Every day | ORAL | 0 refills | Status: DC

## 2022-02-23 MED ORDER — SULFAMETHOXAZOLE-TRIMETHOPRIM 800-160 MG PO TABS: 1.0000 | ORAL_TABLET | Freq: Two times a day (BID) | ORAL | 0 refills | Status: DC

## 2022-02-23 MED ORDER — HYOSCYAMINE SULFATE 0.125 MG PO TABS: 0.1250 mg | ORAL_TABLET | ORAL | 0 refills | Status: DC | PRN

## 2022-02-24 LAB — URINALYSIS, COMPLETE
Bilirubin, UA: NEGATIVE
Glucose, UA: NEGATIVE
Ketones, UA: NEGATIVE
Nitrite, UA: NEGATIVE
Protein,UA: NEGATIVE
Specific Gravity, UA: 1.02 (ref 1.005–1.030)
Urobilinogen, Ur: 0.2 mg/dL (ref 0.2–1.0)
pH, UA: 6 (ref 5.0–7.5)

## 2022-02-24 LAB — MICROSCOPIC EXAMINATION: WBC, UA: >30 /hpf — AB (ref 0–5)

## 2022-02-26 ENCOUNTER — Other Ambulatory Visit: Payer: Self-pay | Admitting: Urology

## 2022-02-26 ENCOUNTER — Telehealth: Payer: Self-pay | Admitting: Family Medicine

## 2022-02-26 DIAGNOSIS — N390 Urinary tract infection, site not specified: Secondary | ICD-10-CM

## 2022-02-26 LAB — CULTURE, URINE COMPREHENSIVE

## 2022-02-26 MED ORDER — TRIMETHOPRIM 100 MG PO TABS: 100.0000 mg | ORAL_TABLET | Freq: Every day | ORAL | 0 refills | Status: DC

## 2022-02-26 NOTE — Telephone Encounter (Signed)
Patient notified and voiced understanding. Appointment has been scheduled. 

## 2022-02-26 NOTE — Telephone Encounter (Signed)
-----  Message from Nori Riis, PA-C sent at 02/26/2022 12:06 PM EST ----- Please let Mrs. Gladney know that her urine culture was positive for infection to continue the Septra DS/Bactrim DS.  When she finishes the antibiotic I want her to start trimethoprim 100 mg daily for 90 days.  I will then need to see her in follow-up in 3 months.  If she continues to have pelvic pain or symptoms of UTI while on the trimethoprim, we will need to see her sooner.

## 2022-05-27 ENCOUNTER — Ambulatory Visit: Payer: Managed Care, Other (non HMO) | Admitting: Urology

## 2022-06-03 ENCOUNTER — Ambulatory Visit: Payer: Managed Care, Other (non HMO) | Admitting: Urology

## 2022-06-08 NOTE — Progress Notes (Unsigned)
06/09/2022 3:41 PM   Chelsea Velasquez 1960-12-12 [id number]  Referring provider: Marisue Ivan, MD [phone] [facility] [address] [facility] [place],  [place] [address]  Urological history: 1.  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 Diverticulum  -cysto (04/2021) - NED -pelvic MRI (05/2021) no diverticulum   2. Renal cysts -CTU (03/2021) - few small benign Bosniak category 1 and 2 renal cysts are again noted.  3. rUTI's -Contributing factors of age, vaginal atrophy -Documented urine cultures are the last year  February 23, 2022 Proteus mirabilis  December 23, 2021 Proteus mirabilis   October 25, 2021 Proteus mirabilis -trimethoprim 100 mg daily      Chief Complaint  Patient presents with   Over Active Bladder    HPI: Chelsea Velasquez is a 62 y.o. female who presents today for three month follow up after a trial of trimethoprim.    She is having 1-7 daytime urination, 1-2 episodes of nocturia with no urge to urinate.  She has urinary incontinence with both stress and urge.  She leaks 3 more times a week.  She wears panty liners daily.  She does engage in toilet mapping.  Patient denies any modifying or aggravating factors.  Patient denies any gross hematuria, dysuria or suprapubic/flank pain.  Patient denies any fevers, chills, nausea or vomiting.    She has not had any infections since being on the trimethoprim 100 mg daily.  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 If you have any questions, ask your nurse or doctor.          STOP taking these medications    sulfamethoxazole-trimethoprim 800-160 MG tablet Commonly known as: BACTRIM DS Stopped by: Michiel Cowboy, PA-C       TAKE these medications    Calcium Carbonate-Vitamin D 600-400 MG-UNIT tablet Take by mouth.   Gemtesa 75 MG Tabs Generic drug: Vibegron Take 1 tablet (75 mg total) by mouth daily.   hyoscyamine 0.125 MG tablet Commonly known as: Levsin Take 1 tablet (0.125 mg total) by mouth every 4 (four) hours as needed.   lovastatin 40 MG tablet Commonly known as: MEVACOR TAKE 1  TABLET(40 MG) BY MOUTH AT BEDTIME   naproxen sodium 220 MG tablet Commonly known as: ALEVE Take by mouth.   Prolia 60 MG/ML Sosy injection Generic drug: denosumab INJECT 60MG  SUBCUTANEOUSLY  EVERY 6 MONTHS   trimethoprim 100 MG tablet Commonly known as: TRIMPEX Take 1 tablet (100 mg total) by mouth daily.   Wegovy 2.4 MG/0.75ML Soaj Generic drug: Semaglutide-Weight Management SMARTSIG:2.4 Milligram(s) SUB-Q Once a Week        Allergies:  Allergies  Allergen Reactions   Doxycycline Other (See Comments)    Caused wheezing, nausea, vomiting, elevated BP, dizziness    Family History: Family History  Problem Relation Age of Onset   Heart disease Father        died age 51   Breast cancer Paternal Grandmother 53   Cancer  Brother 58       throat   Healthy Mother     Social History:  reports that she has never smoked. She has never used smokeless tobacco. She reports current alcohol use of about 3.0 standard drinks of alcohol per week. She reports that she does not use drugs.  ROS: Pertinent ROS in HPI  Physical Exam: BP 114/75   Pulse 68   Ht 5\' 7"  (1.702 m)   Wt 160 lb (72.6 kg)   BMI 25.06 kg/m   Constitutional:  Well nourished. Alert and oriented, No acute distress. HEENT: [place] AT, moist mucus membranes.  Trachea midline, no masses. Cardiovascular: No clubbing, cyanosis, or edema. Respiratory: Normal respiratory effort, no increased work of breathing. Neurologic: Grossly intact, no focal deficits, moving all 4 extremities. Psychiatric: Normal mood and affect.    Laboratory Data: N/A  Pertinent Imaging: N/A  Assessment & Plan:    1. rUTI -Recent CT urogram, pelvic MRI and cystoscopy were negative -she would like to stay on the trimethoprim indefinitely, but I explained to her that long-term antibiotic use can set her up for resistant organisms and we compromised on taking the antibiotic for another 6 months  2.  OAB -since she feels better, she is going to restart her pelvic floor exercises  -Will reassess when she returns in 6 months  Return in about 6 months (around 12/10/2022) for OAB questionnaire.  These notes generated with voice recognition software. I apologize for typographical errors.  Cloretta Ned  [facility] [address]  [address] [place], [place] [address] [phone]

## 2022-06-09 ENCOUNTER — Encounter: Payer: Self-pay | Admitting: Urology

## 2022-06-09 ENCOUNTER — Ambulatory Visit: Payer: Managed Care, Other (non HMO) | Admitting: Urology

## 2022-06-09 VITALS — BP 114/75 | HR 68 | Ht 67.0 in | Wt 160.0 lb

## 2022-06-09 DIAGNOSIS — N390 Urinary tract infection, site not specified: Secondary | ICD-10-CM

## 2022-06-09 DIAGNOSIS — N3281 Overactive bladder: Secondary | ICD-10-CM

## 2022-06-09 DIAGNOSIS — Z87440 Personal history of urinary (tract) infections: Secondary | ICD-10-CM

## 2022-06-10 ENCOUNTER — Other Ambulatory Visit: Payer: Self-pay

## 2022-06-10 DIAGNOSIS — N390 Urinary tract infection, site not specified: Secondary | ICD-10-CM

## 2022-06-10 MED ORDER — TRIMETHOPRIM 100 MG PO TABS: 100.0000 mg | ORAL_TABLET | Freq: Every day | ORAL | 0 refills | Status: DC

## 2022-06-10 NOTE — Telephone Encounter (Signed)
Patient states that medication was never sent to pharmacy yesterday, if you can send it in for her, Thanks

## 2022-06-21 ENCOUNTER — Ambulatory Visit
Admission: EM | Admit: 2022-06-21 | Discharge: 2022-06-21 | Disposition: A | Discharge status: Home / Self Care | Payer: Managed Care, Other (non HMO) | Attending: Emergency Medicine | Admitting: Emergency Medicine

## 2022-06-21 ENCOUNTER — Encounter: Payer: Self-pay | Admitting: Emergency Medicine

## 2022-06-21 DIAGNOSIS — J0140 Acute pansinusitis, unspecified: Secondary | ICD-10-CM | POA: Diagnosis not present

## 2022-06-21 MED ORDER — AMOXICILLIN-POT CLAVULANATE 875-125 MG PO TABS: 1.0000 | ORAL_TABLET | Freq: Two times a day (BID) | ORAL | 0 refills | Status: DC

## 2022-06-21 MED ORDER — FLUTICASONE PROPIONATE 50 MCG/ACT NA SUSP: 2.0000 | Freq: Every day | NASAL | 0 refills | Status: AC

## 2022-06-21 MED ORDER — IBUPROFEN 600 MG PO TABS: 600.0000 mg | ORAL_TABLET | Freq: Four times a day (QID) | ORAL | 0 refills | Status: AC | PRN

## 2022-06-21 NOTE — ED Triage Notes (Signed)
Patient c/o headache, sinus congestion and pressure for a week.  Patient denies fevers.

## 2022-06-21 NOTE — ED Provider Notes (Signed)
HPI  SUBJECTIVE:  Chelsea Velasquez is a 62 y.o. female who presents with 1 week of constant sinus pain and pressure, headache, nasal congestion, purulent rhinorrhea, facial swelling and postnasal drip.  She states this started off as allergy symptoms.  No fevers, upper dental pain, cough.  She has tried multiple over-the-counter antihistamines and Alka-Seltzer cold, Tylenol Sinus without improvement in her symptoms.  No aggravating or alleviating factors.  No antibiotics in the past month.  No antipyretic in the past 6 hours.  She has a past medical history of breast cancer.  No history of frequent sinusitis.  [facility]    Past Medical History:  Diagnosis Date   Breast cancer (HCC) 2010   left   Cancer (HCC) 2010   left lumpectomy with mammosite   Carcinoma in situ of breast 2010   left breast   Cellulitis and abscess of trunk    Inflammatory disease of breast 2012   Liver problem    Lump or mass in breast 2010   left breast   Personal history of radiation therapy    mammosite   Tendonitis 2011   left shoulder    Past Surgical History:  Procedure Laterality Date   ABDOMINAL HYSTERECTOMY  1985   BREAST BIOPSY Left 12/26/2008   invasive ductal carcinoma   BREAST CYST ASPIRATION Left 08/2012   benign done in Dr Luan Moore office   BREAST LUMPECTOMY Left 2010   positive   BREAST SURGERY Left 2010   lumpectomy   COLONOSCOPY  2013   TONSILLECTOMY      Family History  Problem Relation Age of Onset   Heart disease Father        died age 19   Breast cancer Paternal Grandmother 70   Cancer Brother 63       throat   Healthy Mother     Social History   Tobacco Use   Smoking status: Never   Smokeless tobacco: Never  Vaping Use   Vaping Use: Never used  Substance Use Topics   Alcohol use: Yes    Alcohol/week: 3.0 standard drinks of alcohol    Types: 3 Glasses of wine per week   Drug use: No    No current facility-administered medications for this  encounter.  Current Outpatient Medications:    amoxicillin-clavulanate (AUGMENTIN) 875-125 MG tablet, Take 1 tablet by mouth every 12 (twelve) hours., Disp: 14 tablet, Rfl: 0   fluticasone (FLONASE) 50 MCG/ACT nasal spray, Place 2 sprays into both nostrils daily., Disp: 16 g, Rfl: 0   ibuprofen (ADVIL) 600 MG tablet, Take 1 tablet (600 mg total) by mouth every 6 (six) hours as needed., Disp: 30 tablet, Rfl: 0   Calcium Carbonate-Vitamin D 600-400 MG-UNIT tablet, Take by mouth., Disp: , Rfl:    denosumab (PROLIA) 60 MG/ML SOSY injection, INJECT 60MG  SUBCUTANEOUSLY  EVERY 6 MONTHS, Disp: , Rfl:    hyoscyamine (LEVSIN) 0.125 MG tablet, Take 1 tablet (0.125 mg total) by mouth every 4 (four) hours as needed., Disp: 30 tablet, Rfl: 0   lovastatin (MEVACOR) 40 MG tablet, TAKE 1 TABLET(40 MG) BY MOUTH AT BEDTIME, Disp: , Rfl:    trimethoprim (TRIMPEX) 100 MG tablet, Take 1 tablet (100 mg total) by mouth daily., Disp: 90 tablet, Rfl: 0   Vibegron (GEMTESA) 75 MG TABS, Take 1 tablet (75 mg total) by mouth daily., Disp: 42 tablet, Rfl: 0   WEGOVY 2.4 MG/0.75ML SOAJ, SMARTSIG:2.4 Milligram(s) SUB-Q Once a Week, Disp: , Rfl:  Allergies  Allergen Reactions   Doxycycline Other (See Comments)    Caused wheezing, nausea, vomiting, elevated BP, dizziness     ROS  As noted in HPI.   Physical Exam  BP 119/77 (BP Location: Right Arm)   Pulse 68   Temp 98.2 F (36.8 C) (Oral)   Resp 14   Ht 5\' 7"  (1.702 m)   Wt 72.6 kg   SpO2 96%   BMI 25.06 kg/m   Constitutional: Well developed, well nourished, no acute distress Eyes:  EOMI, conjunctiva normal bilaterally HENT: Normocephalic, atraumatic,mucus membranes moist.  Positive exquisite maxillary, frontal sinus tenderness.  Purulent nasal discharge.  Erythematous, swollen turbinates.  No postnasal drip. Respiratory: Normal inspiratory effort Cardiovascular: Normal rate GI: nondistended skin: No rash, skin intact Musculoskeletal: no  deformities Neurologic: Alert & oriented x 3, no focal neuro deficits Psychiatric: Speech and behavior appropriate   ED Course   Medications - No data to display  No orders of the defined types were placed in this encounter.   No results found for this or any previous visit (from the past 24 hour(s)). No results found.  ED Clinical Impression  1. Acute non-recurrent pansinusitis      ED Assessment/Plan      With acute pansinusitis.  She meets criteria for antibiotics given duration of symptoms of facial swelling.  With Augmentin, Flonase, she is to start her Nettie pot, Mucinex D.  Discontinue antihistamines.  Tylenol/ibuprofen 3 times a day.  Follow-up with PCP as needed.   Discussed , MDM, treatment plan, and plan for follow-up with patient.  patient agrees with plan.   Meds ordered this encounter  Medications   fluticasone (FLONASE) 50 MCG/ACT nasal spray    Sig: Place 2 sprays into both nostrils daily.    Dispense:  16 g    Refill:  0   ibuprofen (ADVIL) 600 MG tablet    Sig: Take 1 tablet (600 mg total) by mouth every 6 (six) hours as needed.    Dispense:  30 tablet    Refill:  0   amoxicillin-clavulanate (AUGMENTIN) 875-125 MG tablet    Sig: Take 1 tablet by mouth every 12 (twelve) hours.    Dispense:  14 tablet    Refill:  0      *This clinic note was created using Scientist, clinical (histocompatibility and immunogenetics). Therefore, there may be occasional mistakes despite careful proofreading.  ?    Domenick Gong, MD 06/21/22 [phone]

## 2022-06-21 NOTE — Discharge Instructions (Signed)
Start Mucinex-D to keep the mucous thin and to decongest you.  Stop the allergy medication.  You may take 600 mg of motrin with 1000 mg of tylenol up to 3-4 times a day as needed for pain. This is an effective combination for pain.  Use a Nettie pot or NeilMed sinus rinse with distilled water as often as you want to to reduce nasal congestion. Follow the directions on the box.  Flonase will also help with the nasal congestion.  If the Augmentin, even if you feel better  Go to www.goodrx.com to look up your medications. This will give you a list of where you can find your prescriptions at the most affordable prices. Or you can ask the pharmacist what the cash price is. This is frequently cheaper than going through insurance.

## 2022-09-21 ENCOUNTER — Other Ambulatory Visit: Payer: Self-pay | Admitting: Internal Medicine

## 2022-09-21 DIAGNOSIS — Z1231 Encounter for screening mammogram for malignant neoplasm of breast: Secondary | ICD-10-CM

## 2022-09-22 DIAGNOSIS — N390 Urinary tract infection, site not specified: Secondary | ICD-10-CM

## 2022-09-22 MED ORDER — TRIMETHOPRIM 100 MG PO TABS: 100.0000 mg | ORAL_TABLET | Freq: Every day | ORAL | 0 refills | Status: DC

## 2022-09-22 NOTE — Telephone Encounter (Signed)
Ok to fill 

## 2022-09-25 ENCOUNTER — Ambulatory Visit: Payer: Managed Care, Other (non HMO)

## 2022-09-25 DIAGNOSIS — Z538 Procedure and treatment not carried out for other reasons: Secondary | ICD-10-CM | POA: Diagnosis not present

## 2022-09-25 DIAGNOSIS — Z1211 Encounter for screening for malignant neoplasm of colon: Secondary | ICD-10-CM | POA: Diagnosis present

## 2022-11-03 ENCOUNTER — Ambulatory Visit
Admission: RE | Admit: 2022-11-03 | Discharge: 2022-11-03 | Disposition: A | Discharge status: Home / Self Care | Payer: Managed Care, Other (non HMO) | Source: Ambulatory Visit | Attending: Internal Medicine | Admitting: Internal Medicine

## 2022-11-03 DIAGNOSIS — Z1231 Encounter for screening mammogram for malignant neoplasm of breast: Secondary | ICD-10-CM | POA: Diagnosis present

## 2022-12-10 ENCOUNTER — Ambulatory Visit: Payer: Managed Care, Other (non HMO) | Admitting: Urology

## 2022-12-11 ENCOUNTER — Ambulatory Visit: Payer: Managed Care, Other (non HMO)

## 2022-12-11 DIAGNOSIS — K640 First degree hemorrhoids: Secondary | ICD-10-CM

## 2022-12-11 DIAGNOSIS — Z1211 Encounter for screening for malignant neoplasm of colon: Secondary | ICD-10-CM

## 2022-12-11 DIAGNOSIS — K5730 Diverticulosis of large intestine without perforation or abscess without bleeding: Secondary | ICD-10-CM

## 2022-12-16 NOTE — Progress Notes (Unsigned)
12/17/2022 8:54 AM   Chelsea Velasquez November 04, 1960 [id number]  Referring provider: Marisue Ivan, MD [phone] [facility] [address] [facility] [place],  [place] [address]  Urological history: 1.  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 Diverticulum  -cysto (04/2021) - Velasquez -pelvic MRI (05/2021) no diverticulum   2. Renal cysts -CTU (03/2021) - few small benign Bosniak category 1 and 2 renal cysts are again noted.  3. rUTI's -Contributing factors of age, vaginal atrophy -Documented urine cultures are the last year  February 23, 2022 Proteus mirabilis  December 23, 2021 Proteus mirabilis  -trimethoprim 100 mg daily     No chief complaint on file.  HPI: Chelsea Velasquez is a 62 y.o. female who presents today for 6 month follow up after a trial of trimethoprim.    Previous records reviewed.   PVR ***   PMH: Past Medical History:  Diagnosis Date   Breast cancer (HCC) 2010   left   Cancer (HCC) 2010   left lumpectomy with mammosite   Carcinoma in situ of breast 2010   left breast   Cellulitis and abscess of trunk    Inflammatory disease of breast 2012   Liver problem    Lump or mass in breast 2010   left breast   Personal history of radiation therapy    mammosite   Tendonitis 2011   left shoulder    Surgical History: Past Surgical History:  Procedure Laterality Date   ABDOMINAL HYSTERECTOMY  1985   BREAST BIOPSY Left 12/26/2008   invasive ductal carcinoma   BREAST CYST ASPIRATION Left 08/2012   benign done in Dr Chelsea Velasquez office   BREAST LUMPECTOMY Left 2010   positive   BREAST SURGERY Left 2010   lumpectomy   COLONOSCOPY  2013   TONSILLECTOMY      Home Medications:  Allergies as of 12/17/2022       Reactions   Doxycycline Other (See Comments)   Caused wheezing, nausea, vomiting, elevated BP, dizziness        Medication List        Accurate as of December 16, 2022  8:54 AM. If you have any  questions, ask your nurse or doctor.          amoxicillin-clavulanate 875-125 MG tablet Commonly known as: AUGMENTIN Take 1 tablet by mouth every 12 (twelve) hours.   Calcium Carbonate-Vitamin D 600-400 MG-UNIT tablet Take by mouth.   fluticasone 50 MCG/ACT nasal spray Commonly known as: FLONASE Place 2 sprays into both nostrils daily.   Gemtesa 75 MG Tabs Generic drug: Vibegron Take 1 tablet (75 mg total) by mouth daily.   hyoscyamine 0.125 MG tablet Commonly known as: Levsin Take 1 tablet (0.125 mg total) by mouth every 4 (four) hours as needed.   ibuprofen 600 MG tablet Commonly known as: ADVIL Take 1 tablet (600 mg total) by mouth every 6 (six) hours as needed.   lovastatin 40 MG tablet Commonly known as: MEVACOR TAKE 1 TABLET(40 MG) BY MOUTH AT BEDTIME   Prolia 60 MG/ML Sosy injection Generic drug: denosumab INJECT 60MG  SUBCUTANEOUSLY  EVERY 6 MONTHS   trimethoprim 100 MG tablet Commonly known as: TRIMPEX Take 1 tablet (100 mg total) by mouth daily.   Wegovy 2.4 MG/0.75ML Soaj Generic drug: Semaglutide-Weight Management SMARTSIG:2.4 Milligram(s) SUB-Q Once a Week        Allergies:  Allergies  Allergen Reactions   Doxycycline Other (See Comments)  Caused wheezing, nausea, vomiting, elevated BP, dizziness    Family History: Family History  Problem Relation Age of Onset   Heart disease Father        died age 34   Breast cancer Paternal Grandmother 71   Cancer Brother 27       throat   Healthy Mother     Social History:  reports that she has never smoked. She has never used smokeless tobacco. She reports current alcohol use of about 3.0 standard drinks of alcohol per week. She reports that she does not use drugs.  ROS: Pertinent ROS in HPI  Physical Exam: There were no vitals taken for this visit.  Constitutional:  Well nourished. Alert and oriented, No acute distress. HEENT: [place] AT, moist mucus membranes.  Trachea midline, no  masses. Cardiovascular: No clubbing, cyanosis, or edema. Respiratory: Normal respiratory effort, no increased work of breathing. GU: No CVA tenderness.  No bladder fullness or masses.  Recession of labia minora, dry, pale vulvar vaginal mucosa and loss of mucosal ridges and folds.  Normal urethral meatus, no lesions, no prolapse, no discharge.   No urethral masses, tenderness and/or tenderness. No bladder fullness, tenderness or masses. *** vagina mucosa, *** estrogen effect, no discharge, no lesions, *** pelvic support, *** cystocele and *** rectocele noted.  No cervical motion tenderness.  Uterus is freely mobile and non-fixed.  No adnexal/parametria masses or tenderness noted.  Anus and perineum are without rashes or lesions.   ***  Neurologic: Grossly intact, no focal deficits, moving all 4 extremities. Psychiatric: Normal mood and affect.    Laboratory Data: N/A  Pertinent Imaging: N/A  Assessment & Plan:    1. rUTI -Recent CT urogram, pelvic MRI and cystoscopy were negative -she would like to stay on the trimethoprim indefinitely, but I explained to her that long-term antibiotic use can set her up for resistant organisms and we compromised on taking the antibiotic for another 6 months  2.  OAB -since she feels better, she is going to restart her pelvic floor exercises  -Will reassess when she returns in 6 months  No follow-ups on file.  These notes generated with voice recognition software. I apologize for typographical errors.  Chelsea Velasquez  [facility] [address]  [address] [place], [place] [address] [phone]

## 2022-12-17 ENCOUNTER — Ambulatory Visit: Payer: Managed Care, Other (non HMO) | Admitting: Urology

## 2022-12-17 ENCOUNTER — Encounter: Payer: Self-pay | Admitting: Urology

## 2022-12-17 DIAGNOSIS — N3946 Mixed incontinence: Secondary | ICD-10-CM

## 2022-12-17 DIAGNOSIS — N3281 Overactive bladder: Secondary | ICD-10-CM

## 2022-12-17 DIAGNOSIS — Z87440 Personal history of urinary (tract) infections: Secondary | ICD-10-CM

## 2022-12-17 DIAGNOSIS — N390 Urinary tract infection, site not specified: Secondary | ICD-10-CM

## 2022-12-17 MED ORDER — TRIMETHOPRIM 100 MG PO TABS: 100.0000 mg | ORAL_TABLET | Freq: Every day | ORAL | 3 refills | Status: DC

## 2022-12-17 MED ORDER — GEMTESA 75 MG PO TABS: 75.0000 mg | ORAL_TABLET | Freq: Every day | ORAL | Status: DC

## 2023-09-19 ENCOUNTER — Ambulatory Visit: Payer: Self-pay | Admitting: Family Medicine

## 2023-09-19 ENCOUNTER — Ambulatory Visit
Admission: EM | Admit: 2023-09-19 | Discharge: 2023-09-19 | Disposition: A | Discharge status: Home / Self Care | Attending: Family Medicine | Admitting: Family Medicine

## 2023-09-19 ENCOUNTER — Encounter: Payer: Self-pay | Admitting: Emergency Medicine

## 2023-09-19 ENCOUNTER — Ambulatory Visit (INDEPENDENT_AMBULATORY_CARE_PROVIDER_SITE_OTHER)

## 2023-09-19 DIAGNOSIS — S92311A Displaced fracture of first metatarsal bone, right foot, initial encounter for closed fracture: Secondary | ICD-10-CM | POA: Diagnosis not present

## 2023-09-19 MED ORDER — HYDROCODONE-ACETAMINOPHEN 5-325 MG PO TABS: 1.0000 | ORAL_TABLET | Freq: Four times a day (QID) | ORAL | 0 refills | Status: DC | PRN

## 2023-09-19 NOTE — Discharge Instructions (Addendum)
 You have broken part of the bone off near joint next to your big toe. You were prescribed Norco which is an opoid medication. If you do not need pain medication, you do not have to take it.  Follow up with [facility] in [place] or Consider Dr Eva Gay who works at the [facility].   [facility] in [facility] Call [phone] [address] [place], [place] [address]   [facility]  [address] [facility] [address]  [place] [address] [phone]

## 2023-09-19 NOTE — ED Triage Notes (Signed)
 Patient states that she was going down stairs and her shoe got caught and she twisted her right foot 3 days ago.  Patient reports pain on the top and bottom of her right foot.

## 2023-09-19 NOTE — ED Provider Notes (Signed)
 [facility]    CSN: [id number] Arrival date & time: 09/19/23  1317      History   Chief Complaint Chief Complaint  Patient presents with  . Foot Pain    right    HPI  HPI Chelsea Velasquez is a 63 y.o. female.   Chelsea Velasquez presents for right foot pain that started 3-4 days ago after slipping down the steps. She put some topical gel and soaked it in epsom salt. Took some Aleve  that helped some.  There is bruising and swelling.  Has pain with walking and has been limping.    ***Injury occurred, mechanism and which ***shoulder injured.  Reports *** immediate pain in his shoulder ***. Did ***not hear any pop or abnormal sounds with his injury.  Continues to have *** description pain when moving his arm forward. Denies ***redness, swelling. Does not feel like her ***arm is weak. Has tried *** with little relief.  ***No change in pain day vs night.  Has ***never injured this *** shoulder before.  ***she is ***right handed.    Fever : no  Sore throat: no   Cough: no Appetite: normal  Hydration: normal  Abdominal pain: no Nausea: no Vomiting: no Sleep disturbance: no *** Back Pain: no Headache: no     Past Medical History:  Diagnosis Date  . Breast cancer (HCC) 2010   left  . Cancer [facility]) 2010   left lumpectomy with mammosite  . 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 Cellulitis and abscess of trunk   . Inflammatory disease of breast 2012  . Liver problem   . Lump or mass in breast 2010   left breast  . Personal history of radiation therapy    mammosite  . Tendonitis 2011   left shoulder    Patient Active Problem List   Diagnosis Date Noted  . Breast CA (HCC) 12/23/2015  . Clinical depression 12/23/2015  . Cannot sleep 12/23/2015  . Headache, migraine 12/23/2015  . Osteopenia 12/23/2015  . Avitaminosis D 12/23/2015  . History of breast cancer 07/12/2012  . Bell palsy 12/15/2007  . Allergic rhinitis 12/21/2005  . Hypercholesteremia 09/24/2005     Past Surgical History:  Procedure Laterality Date  . ABDOMINAL HYSTERECTOMY  1985  . BREAST BIOPSY Left 12/26/2008   invasive ductal carcinoma  . BREAST CYST ASPIRATION Left 08/2012   benign done in Dr Guinevere office  . BREAST LUMPECTOMY Left 2010   positive  . BREAST SURGERY Left 2010   lumpectomy  . COLONOSCOPY  2013  . TONSILLECTOMY      OB History     Gravida  1   Para  1   Term      Preterm      AB      Living         SAB      IAB      Ectopic      Multiple      Live Births           Obstetric Comments  1st Menstrual Cycle:  16  1st Pregnancy:  20          Home Medications    Prior to Admission medications   Medication Sig Start Date End Date Taking? Authorizing Provider  amoxicillin -clavulanate (AUGMENTIN ) 875-125 MG tablet Take 1 tablet by mouth every 12 (twelve) hours. 06/21/22   Van Knee, MD  Calcium Carbonate-Vitamin D  600-400 MG-UNIT tablet Take by  mouth.    [provider]  denosumab (PROLIA) 60 MG/ML SOSY injection INJECT 60MG  SUBCUTANEOUSLY  EVERY 6 MONTHS 05/27/21   [provider]  fluticasone  (FLONASE ) 50 MCG/ACT nasal spray Place 2 sprays into both nostrils daily. 06/21/22   Van Knee, MD  hyoscyamine  (LEVSIN ) 0.125 MG tablet Take 1 tablet (0.125 mg total) by mouth every 4 (four) hours as needed. 02/23/22   Helon Kirsch A, PA-C  ibuprofen  (ADVIL ) 600 MG tablet Take 1 tablet (600 mg total) by mouth every 6 (six) hours as needed. 06/21/22   Van Knee, MD  lovastatin (MEVACOR) 40 MG tablet TAKE 1 TABLET(40 MG) BY MOUTH AT BEDTIME 02/23/22   [provider]  trimethoprim  (TRIMPEX ) 100 MG tablet Take 1 tablet (100 mg total) by mouth daily. 12/17/22   Helon Kirsch A, PA-C  Vibegron  (GEMTESA ) 75 MG TABS Take 1 tablet (75 mg total) by mouth daily. 12/17/22   McGowan, Kirsch A, PA-C  WEGOVY 2.4 MG/0.75ML SOAJ SMARTSIG:2.4 Milligram(s) SUB-Q Once a Week    [provider]     Family History Family History  Problem Relation Age of Onset  . Heart disease Father        died age 70  . Breast cancer Paternal Grandmother 67  . Cancer Brother 48       throat  . Healthy Mother     Social History Social History   Tobacco Use  . Smoking status: Never  . Smokeless tobacco: Never  Vaping Use  . Vaping status: Never Used  Substance Use Topics  . Alcohol use: Yes    Alcohol/week: 3.0 standard drinks of alcohol    Types: 3 Glasses of wine per week  . Drug use: No     Allergies   Doxycycline   Review of Systems Review of Systems: :negative unless otherwise stated in HPI.      Physical Exam Triage Vital Signs ED Triage Vitals  Encounter Vitals Group     BP 09/19/23 1337 131/78     Girls Systolic BP Percentile --      Girls Diastolic BP Percentile --      Boys Systolic BP Percentile --      Boys Diastolic BP Percentile --      Pulse Rate 09/19/23 1337 64     Resp 09/19/23 1337 14     Temp 09/19/23 1337 98.2 F (36.8 C)     Temp Source 09/19/23 1337 Oral     SpO2 09/19/23 1337 98 %     Weight 09/19/23 1335 160 lb 0.9 oz (72.6 kg)     Height 09/19/23 1335 5' 7 (1.702 m)     Head Circumference --      Peak Flow --      Pain Score 09/19/23 1335 6     Pain Loc --      Pain Education --      Exclude from Growth Chart --    No data found.  Updated Vital Signs BP 131/78 (BP Location: Left Arm)   Pulse 64   Temp 98.2 F (36.8 C) (Oral)   Resp 14   Ht 5' 7 (1.702 m)   Wt 72.6 kg   SpO2 98%   BMI 25.07 kg/m   Visual Acuity Right Eye Distance:   Left Eye Distance:   Bilateral Distance:    Right Eye Near:   Left Eye Near:    Bilateral Near:     Physical Exam GEN: well appearing female in  no acute distress  CVS: well perfused  RESP: speaking in full sentences without pause, no respiratory distress  MSK:   *** shoulder:  No evidence of bony deformity, asymmetry, or muscle atrophy. No tenderness over long head of biceps  (bicipital groove).  No TTP at [facility] joint.  Full active and passive (ABD, ADD, Flexion, extension, IR, ER). Limited *** 2/2 to pain.  Strength 5/5 grip, elbow and shoulder. No abnormal scapular function observed.  Special Tests: Vonzell: ***; Empty Can: ***, Neer's: Negative; Painful arc: Negative; Anterior Apprehension: Negative Sensation intact. Peripheral pulses intact.   UC Treatments / Results  Labs (all labs ordered are listed, but only abnormal results are displayed) Labs Reviewed - No data to display  EKG   Radiology No results found.   Procedures Procedures (including critical care time)  Medications Ordered in UC Medications - No data to display  Initial Impression / Assessment and Plan / UC Course  I have reviewed the triage vital signs and the nursing notes.  Pertinent labs & imaging results that were available during my care of the patient were reviewed by me and considered in my medical decision making (see chart for details).      Pt is a 63 y.o.  female with *** days of *** shoulder pain after ***.   On exam, pt has tenderness at *** concerning for ***.   Obtained *** shoulder plain films.  Personally interpreted by me were ***unremarkable for fracture or dislocation. Radiologist report reviewed and additionally notes *** no soft tissue swelling.  Given ***Toradol IM/sling/brace/crutches  Patient to gradually return to normal activities, as tolerated and continue ordinary activities within the limits permitted by pain. Prescribed Naproxen  sodium *** and muscle relaxer *** for pain relief.  Tylenol  PRN. Advised patient to avoid OTC NSAIDs while taking prescription NSAID. Counseled patient on red flag symptoms and when to seek immediate care.  ***No red flags such as progressive major motor weakness.   Patient to follow up with orthopedic provider, if symptoms do not improve with conservative treatment.  Return and ED precautions given. Understanding voiced.  Discussed MDM, treatment plan and plan for follow-up with patient/parent who agrees with plan.   Final Clinical Impressions(s) / UC Diagnoses   Final diagnoses:  None   Discharge Instructions   None    ED Prescriptions   None    PDMP not reviewed this encounter.

## 2023-10-04 ENCOUNTER — Other Ambulatory Visit: Payer: Self-pay | Admitting: Family Medicine

## 2023-10-04 DIAGNOSIS — Z1231 Encounter for screening mammogram for malignant neoplasm of breast: Secondary | ICD-10-CM

## 2023-11-05 ENCOUNTER — Ambulatory Visit
Admission: RE | Admit: 2023-11-05 | Discharge: 2023-11-05 | Disposition: A | Discharge status: Home / Self Care | Source: Ambulatory Visit | Attending: Family Medicine | Admitting: Family Medicine

## 2023-11-05 DIAGNOSIS — Z1231 Encounter for screening mammogram for malignant neoplasm of breast: Secondary | ICD-10-CM | POA: Diagnosis present

## 2023-12-14 NOTE — Progress Notes (Signed)
 12/16/2023 9:33 PM   Chelsea Velasquez 1961-01-11 [id number]  Referring provider: Alla Amis, MD [phone] [facility] [address] [facility] [place],  [place] [address]  Urological history: 1.  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 Diverticulum  -cysto (04/2021) - NED -pelvic MRI (05/2021) no diverticulum   2. Renal cysts -CTU (03/2021) - few small benign Bosniak category 1 and 2 renal cysts are again noted.  3. rUTI's -Contributing factors of age, vaginal atrophy -trimethoprim  100 mg daily     Chief Complaint  Patient presents with   Over Active Bladder   HPI: Chelsea Velasquez is a 62 y.o. female who presents today for 6 month follow up after a trial of trimethoprim .    Previous records reviewed.   She is having 1-7 daytime voids, 1-2 episodes of nocturia with strong urge to urinate.  She is having mixed urinary incontinence.  She is leaking 3 more times daily.  She wears a pad and depends daily.  She does sometimes limit fluid intake and she does engage in toilet mapping.  She has been having these issues for a long time, but she had primarily been focused on her recurrent UTIs, but since starting the trimethoprim  100 mg daily, she has not had a recurrence and now she would like to concentrate on her incontinence.  Patient denies any modifying or aggravating factors.  Patient denies any recent UTI's, gross hematuria, dysuria or suprapubic/flank pain.  Patient denies any fevers, chills, nausea or vomiting.    UA yellow clear, specific gravity 1.025, pH 6.0, 0-5 WBCs, 0-2 RBCs, 0-10 epithelial cells, hyaline cast present and a few bacteria  PVR 21 mL   Serum creatinine (08/2023) 0.81, eGFR 82   Hemoglobin A1c (08/2023) 5.3   She drinks mostly water and 1 cup of coffee daily  She had Gemtesa  in the past, but she could not remember if it made a difference.  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   Cellulitis and abscess of trunk    Inflammatory disease of breast 2012   Liver problem    Lump or mass in breast 2010   left breast   Personal history of radiation therapy    mammosite   Tendonitis 2011   left shoulder    Surgical History: Past Surgical History:  Procedure Laterality Date   ABDOMINAL HYSTERECTOMY  1985   BREAST BIOPSY Left 12/26/2008   invasive ductal carcinoma   BREAST CYST ASPIRATION Left 08/2012   benign done in Dr Guinevere office   BREAST LUMPECTOMY Left 2010   positive   BREAST SURGERY Left 2010   lumpectomy   COLONOSCOPY  2013   TONSILLECTOMY      Home Medications:  Allergies as of 12/16/2023       Reactions   Doxycycline Other (See Comments)   Caused wheezing, nausea, vomiting, elevated BP, dizziness        Medication List        Accurate as of December 16, 2023 11:59 PM. If you have any questions, ask your nurse or doctor.          STOP taking these medications    HYDROcodone -acetaminophen  5-325 MG tablet Commonly known as: NORCO/VICODIN Stopped by: Chelsea Velasquez   hyoscyamine  0.125  MG tablet Commonly known as: Levsin  Stopped by: Chelsea Velasquez       TAKE these medications    Calcium Carbonate-Vitamin D  600-400 MG-UNIT tablet Take by mouth.   fluticasone  50 MCG/ACT nasal spray Commonly known as: FLONASE  Place 2 sprays into both nostrils daily.   Gemtesa  75 MG Tabs Generic drug: Vibegron  Take 1 tablet (75 mg total) by mouth daily.   ibuprofen  600 MG tablet Commonly known as: ADVIL  Take 1 tablet (600 mg total) by mouth every 6 (six) hours as needed.   lovastatin 40 MG tablet Commonly known as: MEVACOR TAKE 1 TABLET(40 MG) BY MOUTH AT BEDTIME   Prolia 60 MG/ML Sosy injection Generic drug: denosumab INJECT 60MG  SUBCUTANEOUSLY  EVERY 6 MONTHS   trimethoprim  100 MG tablet Commonly  known as: TRIMPEX  Take 1 tablet (100 mg total) by mouth daily.   Wegovy 2.4 MG/0.75ML Soaj SQ injection Generic drug: semaglutide-weight management SMARTSIG:2.4 Milligram(s) SUB-Q Once a Week        Allergies:  Allergies  Allergen Reactions   Doxycycline Other (See Comments)    Caused wheezing, nausea, vomiting, elevated BP, dizziness    Family History: Family History  Problem Relation Age of Onset   Heart disease Father        died age 64   Breast cancer Paternal Grandmother 11   Cancer Brother 70       throat   Healthy Mother     Social History:  reports that she has never smoked. She has never used smokeless tobacco. She reports current alcohol use of about 3.0 standard drinks of alcohol per week. She reports that she does not use drugs.  ROS: Pertinent ROS in HPI  Physical Exam: Blood pressure 118/78, pulse 73, height 5' 7 (1.702 m), weight 160 lb (72.6 kg).  Constitutional:  Well nourished. Alert and oriented, No acute distress. HEENT: [place] AT, moist mucus membranes.  Trachea midline Cardiovascular: No clubbing, cyanosis, or edema. Respiratory: Normal respiratory effort, no increased work of breathing. Neurologic: Grossly intact, no focal deficits, moving all 4 extremities. Psychiatric: Normal mood and affect.    Laboratory Data: N/A  Pertinent Imaging:  12/17/23 21:21  Scan Result 21 mL    Assessment & Plan:    1. rUTI - No recurrences since she has been on trimethoprim  100 mg daily - I have sent a refill for that medication to keep her on it as she is very anxious about having another UTI   2.  Mixed urinary incontinence - Behavioral: Initiate bladder retraining and timed voiding and pelvic floor physical therapy referral for Kegel exercises and biofeedback  - explained that there are two main families of medications:   Anticholinergics - available in generic formulations, making them more affordable.  Common side effects: dry eyes, dry mouth,  constipation.  Special consideration: risk of cognitive changes in patients over age 63.   Beta-3 agonists - limited side effect profile and generally well tolerated.  Limitation: currently no generic options, making them cost-prohibitive for many patients.  - Pharmacologic: Trial of Gemtesa  75 mg daily for urgency component - Follow-up: She will call me regarding the effectiveness or MyChart me of the Gemtesa  as her co-pays are $70 to see a specialist and she cannot afford to come back for a 75-month follow-up in office  Return for patient will call with effectiveness of Gemtesa  .  These notes generated with voice recognition software. I apologize for typographical errors.  Chelsea Velasquez  [facility]  [address]  [address] [place], [place] [address] [phone]

## 2023-12-16 ENCOUNTER — Encounter: Payer: Self-pay | Admitting: Urology

## 2023-12-16 ENCOUNTER — Ambulatory Visit: Payer: Self-pay | Admitting: Urology

## 2023-12-16 VITALS — BP 118/78 | HR 73 | Ht 67.0 in | Wt 160.0 lb

## 2023-12-16 DIAGNOSIS — N3946 Mixed incontinence: Secondary | ICD-10-CM | POA: Diagnosis not present

## 2023-12-16 DIAGNOSIS — N390 Urinary tract infection, site not specified: Secondary | ICD-10-CM | POA: Diagnosis not present

## 2023-12-16 LAB — URINALYSIS, COMPLETE
Bilirubin, UA: NEGATIVE
Glucose, UA: NEGATIVE
Ketones, UA: NEGATIVE
Leukocytes,UA: NEGATIVE
Nitrite, UA: NEGATIVE
Protein,UA: NEGATIVE
RBC, UA: NEGATIVE
Specific Gravity, UA: 1.025 (ref 1.005–1.030)
Urobilinogen, Ur: 0.2 mg/dL (ref 0.2–1.0)
pH, UA: 6 (ref 5.0–7.5)

## 2023-12-16 LAB — MICROSCOPIC EXAMINATION

## 2023-12-16 MED ORDER — GEMTESA 75 MG PO TABS: 75.0000 mg | ORAL_TABLET | Freq: Every day | ORAL | Status: DC

## 2023-12-16 MED ORDER — TRIMETHOPRIM 100 MG PO TABS: 100.0000 mg | ORAL_TABLET | Freq: Every day | ORAL | 3 refills | Status: AC

## 2023-12-17 LAB — BLADDER SCAN AMB NON-IMAGING: Scan Result: 21

## 2023-12-21 ENCOUNTER — Other Ambulatory Visit: Payer: Self-pay | Admitting: Urology

## 2023-12-21 DIAGNOSIS — N3946 Mixed incontinence: Secondary | ICD-10-CM

## 2023-12-21 MED ORDER — GEMTESA 75 MG PO TABS: 75.0000 mg | ORAL_TABLET | Freq: Every day | ORAL | 1 refills | Status: DC

## 2024-01-04 ENCOUNTER — Other Ambulatory Visit: Payer: Self-pay | Admitting: Urology

## 2024-01-04 DIAGNOSIS — N3281 Overactive bladder: Secondary | ICD-10-CM

## 2024-01-04 MED ORDER — TROSPIUM CHLORIDE 20 MG PO TABS: 20.0000 mg | ORAL_TABLET | Freq: Two times a day (BID) | ORAL | 3 refills | Status: AC
